# Patient Record
Sex: Male | Born: 1940 | Race: Black or African American | Hispanic: No | Marital: Single | Smoking: Never smoker
Health system: Southern US, Community
[De-identification: ages and names within clinical notes are randomized; demographics above are authoritative.]

## PROBLEM LIST (undated history)

## (undated) DIAGNOSIS — I10 Essential (primary) hypertension: Secondary | ICD-10-CM

## (undated) DIAGNOSIS — G8194 Hemiplegia, unspecified affecting left nondominant side: Secondary | ICD-10-CM

## (undated) DIAGNOSIS — N186 End stage renal disease: Secondary | ICD-10-CM

## (undated) DIAGNOSIS — I4510 Unspecified right bundle-branch block: Secondary | ICD-10-CM

## (undated) DIAGNOSIS — N2581 Secondary hyperparathyroidism of renal origin: Secondary | ICD-10-CM

## (undated) DIAGNOSIS — Z8673 Personal history of transient ischemic attack (TIA), and cerebral infarction without residual deficits: Secondary | ICD-10-CM

## (undated) DIAGNOSIS — E785 Hyperlipidemia, unspecified: Secondary | ICD-10-CM

## (undated) DIAGNOSIS — Z8719 Personal history of other diseases of the digestive system: Secondary | ICD-10-CM

## (undated) DIAGNOSIS — Z992 Dependence on renal dialysis: Secondary | ICD-10-CM

## (undated) DIAGNOSIS — C609 Malignant neoplasm of penis, unspecified: Secondary | ICD-10-CM

## (undated) DIAGNOSIS — H409 Unspecified glaucoma: Secondary | ICD-10-CM

## (undated) HISTORY — PX: DESTRUCTION LESION PENILE: SUR414

## (undated) HISTORY — PX: DG AV DIALYSIS GRAFT DECLOT OR: HXRAD813

---

## 1999-03-06 ENCOUNTER — Encounter (HOSPITAL_COMMUNITY): Admission: RE | Admit: 1999-03-06 | Discharge: 1999-06-04 | Payer: Self-pay | Admitting: Nephrology

## 1999-06-08 ENCOUNTER — Encounter (HOSPITAL_COMMUNITY): Admission: RE | Admit: 1999-06-08 | Discharge: 1999-09-06 | Payer: Self-pay | Admitting: Nephrology

## 1999-09-20 ENCOUNTER — Encounter (HOSPITAL_COMMUNITY): Admission: RE | Admit: 1999-09-20 | Discharge: 1999-12-19 | Payer: Self-pay | Admitting: Nephrology

## 1999-12-21 ENCOUNTER — Encounter (HOSPITAL_COMMUNITY): Admission: RE | Admit: 1999-12-21 | Discharge: 2000-03-20 | Payer: Self-pay | Admitting: Nephrology

## 2000-03-21 ENCOUNTER — Encounter (HOSPITAL_COMMUNITY): Admission: RE | Admit: 2000-03-21 | Discharge: 2000-06-19 | Payer: Self-pay | Admitting: Nephrology

## 2000-06-20 ENCOUNTER — Encounter (HOSPITAL_COMMUNITY): Admission: RE | Admit: 2000-06-20 | Discharge: 2000-09-18 | Payer: Self-pay | Admitting: Nephrology

## 2000-09-19 ENCOUNTER — Encounter (HOSPITAL_COMMUNITY): Admission: RE | Admit: 2000-09-19 | Discharge: 2000-12-18 | Payer: Self-pay | Admitting: Nephrology

## 2000-12-19 ENCOUNTER — Encounter (HOSPITAL_COMMUNITY): Admission: RE | Admit: 2000-12-19 | Discharge: 2001-03-19 | Payer: Self-pay | Admitting: Critical Care Medicine

## 2001-03-26 ENCOUNTER — Encounter (HOSPITAL_COMMUNITY): Admission: RE | Admit: 2001-03-26 | Discharge: 2001-06-24 | Payer: Self-pay | Admitting: Nephrology

## 2001-07-03 ENCOUNTER — Encounter (HOSPITAL_COMMUNITY): Admission: RE | Admit: 2001-07-03 | Discharge: 2001-10-01 | Payer: Self-pay | Admitting: Nephrology

## 2001-10-02 ENCOUNTER — Encounter: Admission: RE | Admit: 2001-10-02 | Discharge: 2001-12-31 | Payer: Self-pay | Admitting: Nephrology

## 2002-01-01 ENCOUNTER — Encounter (HOSPITAL_COMMUNITY): Admission: RE | Admit: 2002-01-01 | Discharge: 2002-04-01 | Payer: Self-pay | Admitting: Nephrology

## 2002-04-02 ENCOUNTER — Encounter (HOSPITAL_COMMUNITY): Admission: RE | Admit: 2002-04-02 | Discharge: 2002-07-01 | Payer: Self-pay | Admitting: Nephrology

## 2002-07-02 ENCOUNTER — Encounter (HOSPITAL_COMMUNITY): Admission: RE | Admit: 2002-07-02 | Discharge: 2002-09-30 | Payer: Self-pay | Admitting: Nephrology

## 2002-07-23 ENCOUNTER — Encounter: Payer: Self-pay | Admitting: Vascular Surgery

## 2002-07-28 ENCOUNTER — Ambulatory Visit (HOSPITAL_COMMUNITY): Admission: RE | Admit: 2002-07-28 | Discharge: 2002-07-28 | Payer: Self-pay | Admitting: Vascular Surgery

## 2002-10-01 ENCOUNTER — Encounter (HOSPITAL_COMMUNITY): Admission: RE | Admit: 2002-10-01 | Discharge: 2002-12-30 | Payer: Self-pay | Admitting: Nephrology

## 2002-11-06 ENCOUNTER — Ambulatory Visit (HOSPITAL_COMMUNITY): Admission: RE | Admit: 2002-11-06 | Discharge: 2002-11-06 | Payer: Self-pay | Admitting: Gastroenterology

## 2002-12-31 ENCOUNTER — Encounter (HOSPITAL_COMMUNITY): Admission: RE | Admit: 2002-12-31 | Discharge: 2003-03-31 | Payer: Self-pay | Admitting: Nephrology

## 2003-04-08 ENCOUNTER — Encounter (HOSPITAL_COMMUNITY): Admission: RE | Admit: 2003-04-08 | Discharge: 2003-07-07 | Payer: Self-pay | Admitting: Nephrology

## 2003-07-08 ENCOUNTER — Encounter (HOSPITAL_COMMUNITY): Admission: RE | Admit: 2003-07-08 | Discharge: 2003-10-06 | Payer: Self-pay | Admitting: Nephrology

## 2003-10-07 ENCOUNTER — Encounter (HOSPITAL_COMMUNITY): Admission: RE | Admit: 2003-10-07 | Discharge: 2004-01-05 | Payer: Self-pay | Admitting: Nephrology

## 2004-01-12 ENCOUNTER — Encounter (HOSPITAL_COMMUNITY): Admission: RE | Admit: 2004-01-12 | Discharge: 2004-04-11 | Payer: Self-pay | Admitting: Nephrology

## 2004-04-20 ENCOUNTER — Encounter (HOSPITAL_COMMUNITY): Admission: RE | Admit: 2004-04-20 | Discharge: 2004-07-19 | Payer: Self-pay | Admitting: Nephrology

## 2004-07-20 ENCOUNTER — Encounter (HOSPITAL_COMMUNITY): Admission: RE | Admit: 2004-07-20 | Discharge: 2004-10-18 | Payer: Self-pay | Admitting: Nephrology

## 2004-10-26 ENCOUNTER — Encounter (HOSPITAL_COMMUNITY): Admission: RE | Admit: 2004-10-26 | Discharge: 2005-01-24 | Payer: Self-pay | Admitting: Nephrology

## 2005-01-08 ENCOUNTER — Ambulatory Visit: Payer: Self-pay | Admitting: Hematology & Oncology

## 2005-02-01 ENCOUNTER — Encounter (HOSPITAL_COMMUNITY): Admission: RE | Admit: 2005-02-01 | Discharge: 2005-05-02 | Payer: Self-pay | Admitting: Nephrology

## 2005-02-28 ENCOUNTER — Ambulatory Visit: Payer: Self-pay | Admitting: Hematology & Oncology

## 2005-03-13 ENCOUNTER — Ambulatory Visit: Admission: RE | Admit: 2005-03-13 | Discharge: 2005-03-13 | Payer: Self-pay | Admitting: Hematology & Oncology

## 2005-03-13 ENCOUNTER — Encounter: Payer: Self-pay | Admitting: Hematology & Oncology

## 2005-03-13 ENCOUNTER — Encounter (INDEPENDENT_AMBULATORY_CARE_PROVIDER_SITE_OTHER): Payer: Self-pay | Admitting: *Deleted

## 2005-03-16 ENCOUNTER — Ambulatory Visit: Payer: Self-pay | Admitting: Hematology & Oncology

## 2005-05-03 ENCOUNTER — Encounter (HOSPITAL_COMMUNITY): Admission: RE | Admit: 2005-05-03 | Discharge: 2005-08-01 | Payer: Self-pay | Admitting: Nephrology

## 2005-08-02 ENCOUNTER — Encounter (HOSPITAL_COMMUNITY): Admission: RE | Admit: 2005-08-02 | Discharge: 2005-10-31 | Payer: Self-pay | Admitting: Nephrology

## 2005-11-01 ENCOUNTER — Encounter (HOSPITAL_COMMUNITY): Admission: RE | Admit: 2005-11-01 | Discharge: 2006-01-30 | Payer: Self-pay | Admitting: Nephrology

## 2006-02-07 ENCOUNTER — Encounter (HOSPITAL_COMMUNITY): Admission: RE | Admit: 2006-02-07 | Discharge: 2006-05-08 | Payer: Self-pay | Admitting: Nephrology

## 2006-05-16 ENCOUNTER — Encounter (HOSPITAL_COMMUNITY): Admission: RE | Admit: 2006-05-16 | Discharge: 2006-08-14 | Payer: Self-pay | Admitting: Critical Care Medicine

## 2006-08-15 ENCOUNTER — Encounter (HOSPITAL_COMMUNITY): Admission: RE | Admit: 2006-08-15 | Discharge: 2006-11-13 | Payer: Self-pay | Admitting: Nephrology

## 2006-11-14 ENCOUNTER — Encounter (HOSPITAL_COMMUNITY): Admission: RE | Admit: 2006-11-14 | Discharge: 2007-02-12 | Payer: Self-pay | Admitting: Nephrology

## 2006-12-16 ENCOUNTER — Ambulatory Visit (HOSPITAL_COMMUNITY): Admission: RE | Admit: 2006-12-16 | Discharge: 2006-12-16 | Payer: Self-pay | Admitting: Nephrology

## 2006-12-30 ENCOUNTER — Ambulatory Visit: Payer: Self-pay | Admitting: Surgery

## 2007-01-02 ENCOUNTER — Ambulatory Visit (HOSPITAL_COMMUNITY): Admission: RE | Admit: 2007-01-02 | Discharge: 2007-01-02 | Payer: Self-pay | Admitting: Surgery

## 2007-01-02 ENCOUNTER — Ambulatory Visit: Payer: Self-pay | Admitting: Surgery

## 2007-01-10 ENCOUNTER — Ambulatory Visit (HOSPITAL_COMMUNITY): Admission: RE | Admit: 2007-01-10 | Discharge: 2007-01-10 | Payer: Self-pay | Admitting: Surgery

## 2007-02-18 ENCOUNTER — Ambulatory Visit (HOSPITAL_COMMUNITY): Admission: RE | Admit: 2007-02-18 | Discharge: 2007-02-18 | Payer: Self-pay | Admitting: Vascular Surgery

## 2007-03-27 ENCOUNTER — Ambulatory Visit (HOSPITAL_COMMUNITY): Admission: RE | Admit: 2007-03-27 | Discharge: 2007-03-27 | Payer: Self-pay | Admitting: Nephrology

## 2007-05-20 ENCOUNTER — Emergency Department (HOSPITAL_COMMUNITY): Admission: EM | Admit: 2007-05-20 | Discharge: 2007-05-20 | Payer: Self-pay | Admitting: Emergency Medicine

## 2007-05-23 ENCOUNTER — Ambulatory Visit (HOSPITAL_COMMUNITY): Admission: RE | Admit: 2007-05-23 | Discharge: 2007-05-24 | Payer: Self-pay | Admitting: Urology

## 2007-05-23 ENCOUNTER — Encounter: Payer: Self-pay | Admitting: Urology

## 2007-05-23 HISTORY — PX: CIRCUMCISION: SUR203

## 2007-09-01 ENCOUNTER — Ambulatory Visit (HOSPITAL_COMMUNITY): Admission: RE | Admit: 2007-09-01 | Discharge: 2007-09-01 | Payer: Self-pay | Admitting: Nephrology

## 2007-09-29 ENCOUNTER — Ambulatory Visit: Payer: Self-pay | Admitting: Surgery

## 2007-10-04 ENCOUNTER — Ambulatory Visit (HOSPITAL_COMMUNITY): Admission: RE | Admit: 2007-10-04 | Discharge: 2007-10-04 | Payer: Self-pay | Admitting: Vascular Surgery

## 2007-10-04 ENCOUNTER — Ambulatory Visit: Payer: Self-pay | Admitting: Vascular Surgery

## 2007-11-26 ENCOUNTER — Ambulatory Visit (HOSPITAL_COMMUNITY): Admission: RE | Admit: 2007-11-26 | Discharge: 2007-11-26 | Payer: Self-pay | Admitting: Urology

## 2007-11-26 HISTORY — PX: OTHER SURGICAL HISTORY: SHX169

## 2008-01-07 ENCOUNTER — Ambulatory Visit (HOSPITAL_COMMUNITY): Admission: RE | Admit: 2008-01-07 | Discharge: 2008-01-07 | Payer: Self-pay | Admitting: Nephrology

## 2008-02-13 IMAGING — XA IR VENTRICULOPERITONEALSHUNT EVAL/INJ
2 series · 15 of 24 positions shown · non-contrast
Comparison: none

CLINICAL DATA: 66-year-old male with increased re-circulation and recent graft thrombosis.  
ULTRASOUND GUIDANCE FOR VASCULAR ACCESS:
LEFT UPPER EXTREMITY ARTERIOVENOUS SHUNTOGRAM:
Radiologist:  Oma Luttrell, M.D.
Guidance:  Ultrasound.
Complications:  No immediate complications.
Medications:  1% local lidocaine initially for the shuntogram.
Procedure/Findings:  Informed consent was obtained from the patient following explanation of the procedure, risks, benefits, and alternatives.  The patient understands, agrees, and consents.  All questions were addressed.
Ultrasound interrogation confirms thrombosis of the left forearm straight graft.  No palpable thrill or pulse.  Under sterile conditions and local anesthesia, micropuncture access was performed followed by insertion of a guidewire.  A 6-French sheath was inserted.  Through the sheath, a Kumpe catheter and a glidewire were used to attempt access of the venous anastomosis into the central veins.  With some difficulty, eventually the venous anastomosis was crossed.  A pull-back venogram demonstrated thrombosis of the outflow cephalic vein with clavicular region collaterals.  This is suspicious for central occlusion.  The venous anastomosis was also visualized.  There was some contrast extravasation in the upper arm with stagnation of flow.  The entire graft was thrombosed.  With the glidewire and Kumpe catheter, attempts were utilized to access the central veins; however, this was unsuccessful.  A subtraction central venogram was performed, confirming central venous occlusion of the cephalic vein and thrombosis.   Collateralization eventually opacifies the small caliber subclavian and innominate veins which are patent.   Because of the central outflow cephalic occlusion and thrombosis, the graft was not thrombectomized.  The central cephalic occlusion appears chronic with extensive collateralization.  Therefore, the access site will likely re-thrombosis following thrombectomy and thrombolysis.  At this point, the procedure was stopped.  The catheter and guidewires were removed.  The sheath was removed.  Hemostasis was achieved with compression.  The patient tolerated the procedure well.

[Series 1: run · 9 of 17 slices shown (1 of 2)]
[im 1/17]
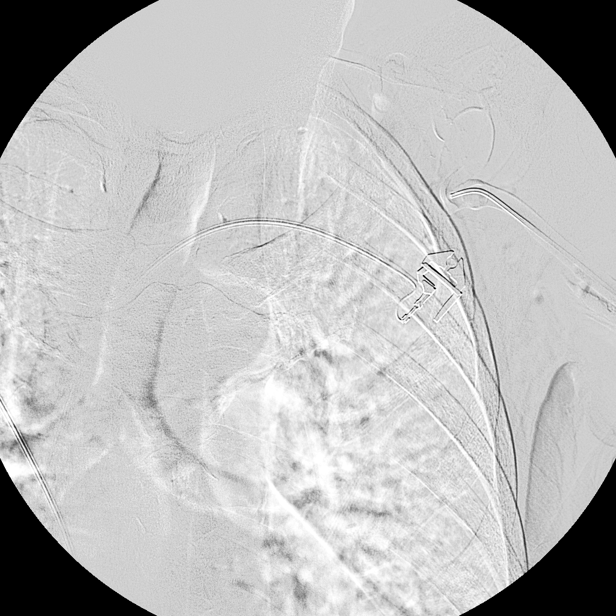
[im 3/17]
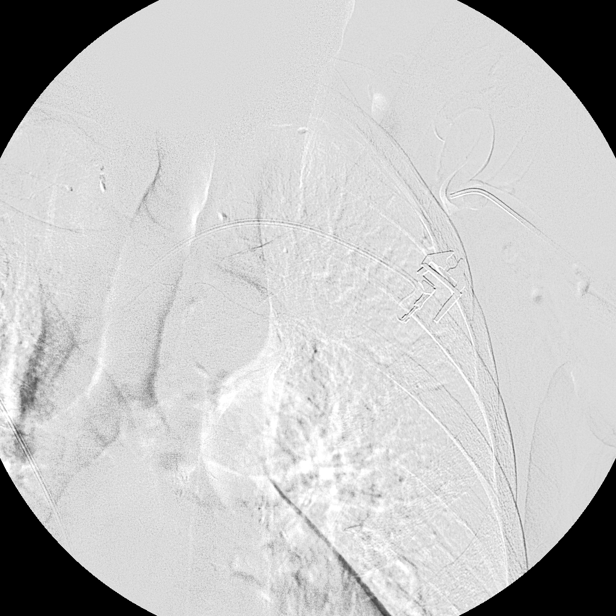
[im 5/17]
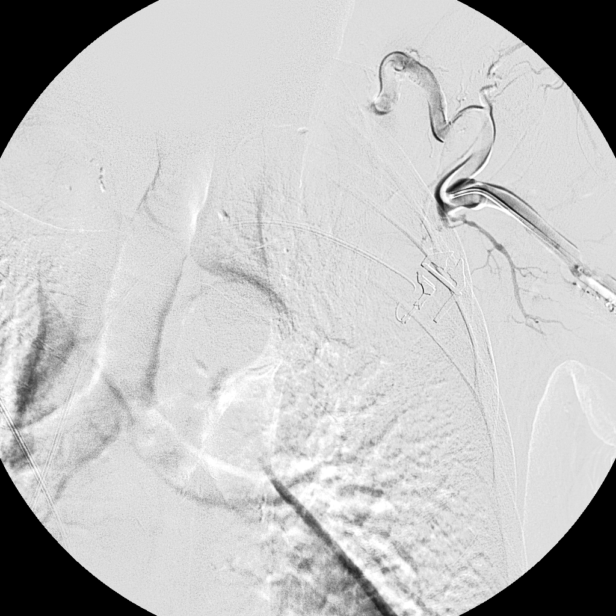
[im 6/17]
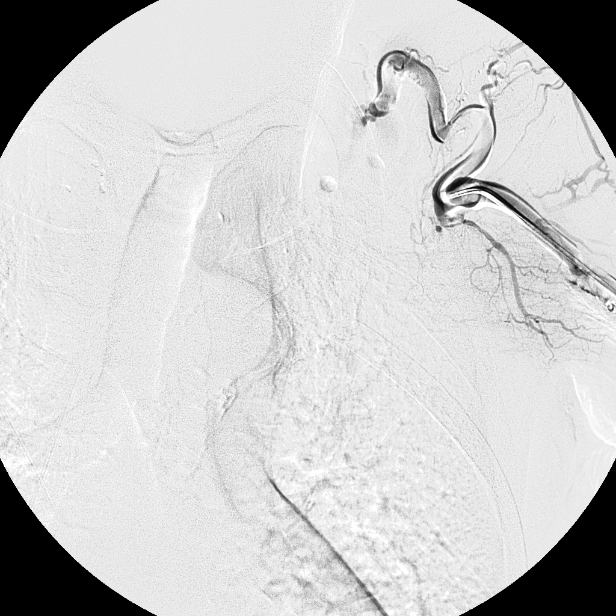
[im 9/17]
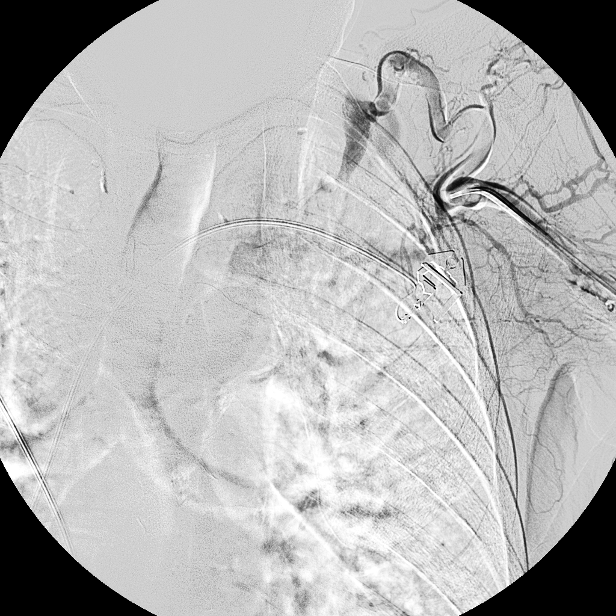
[im 10/17]
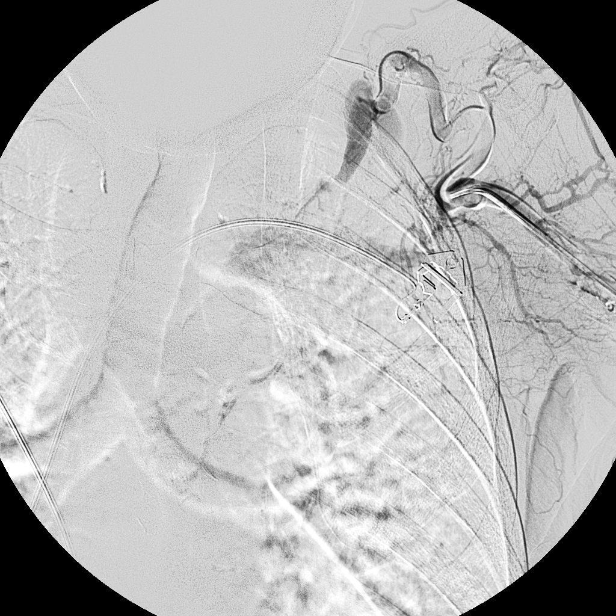
[im 12/17]
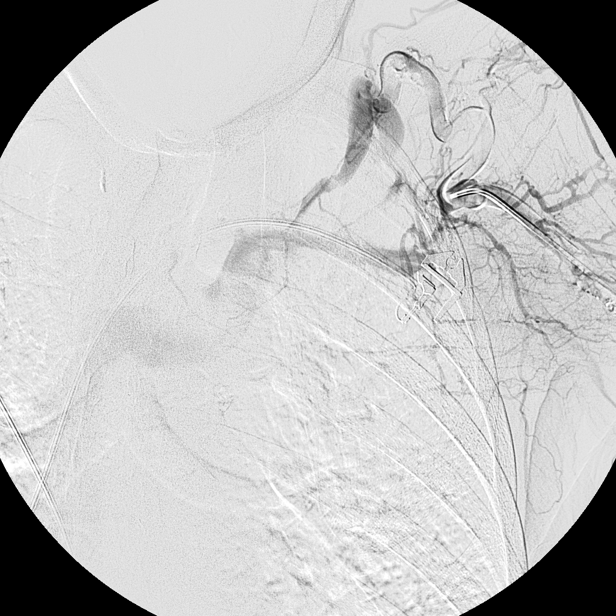
[im 14/17]
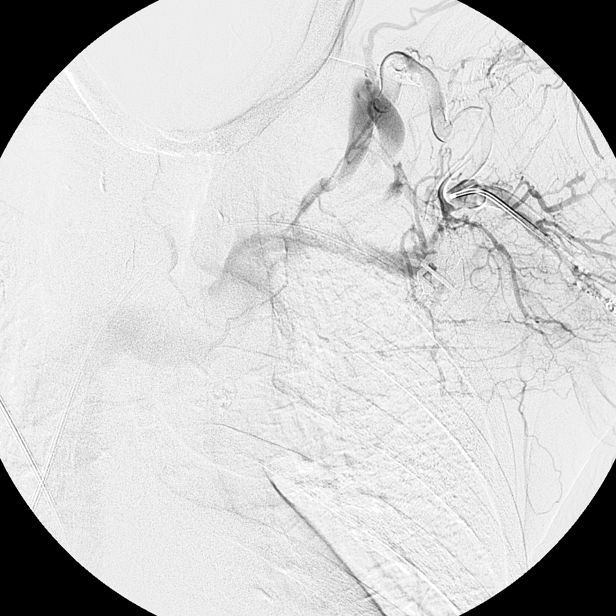
[im 15/17]
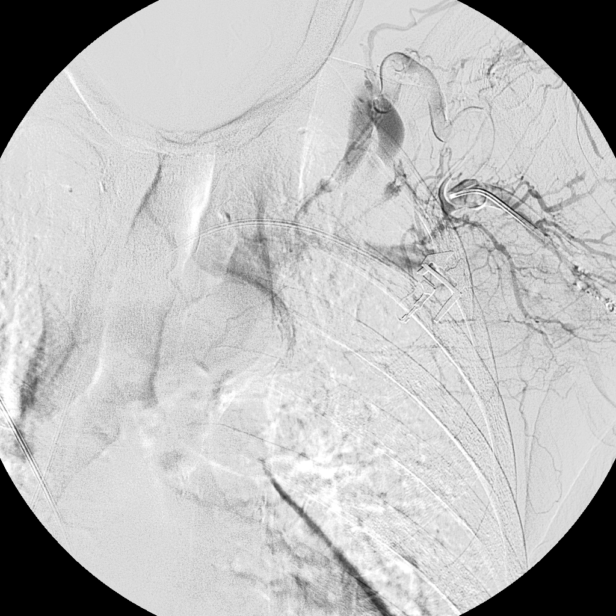

[Series 1: run · 6 of 11 slices shown (2 of 2)]
[im 1/11]
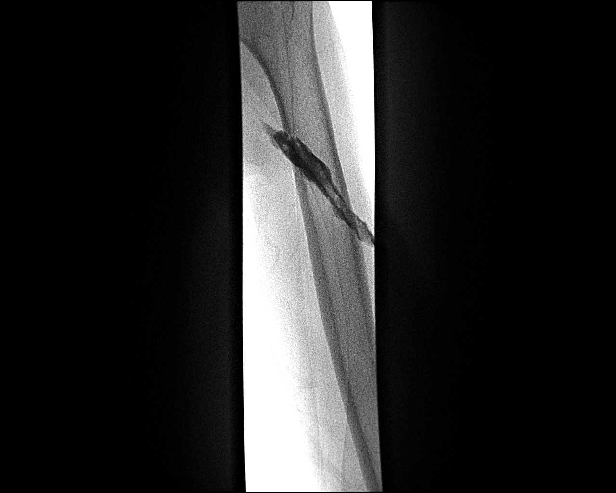
[im 2/11]
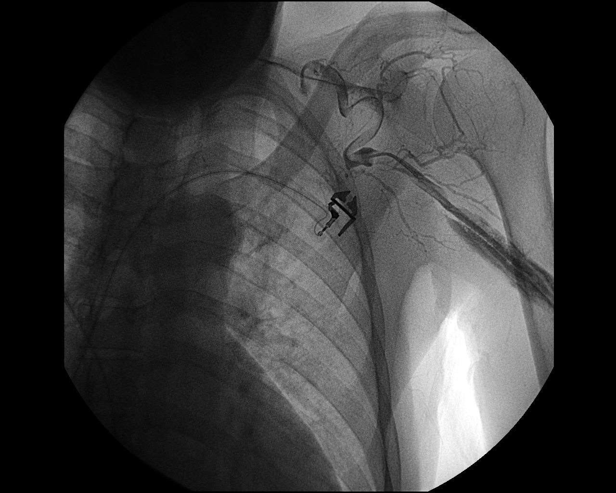
[im 4/11]
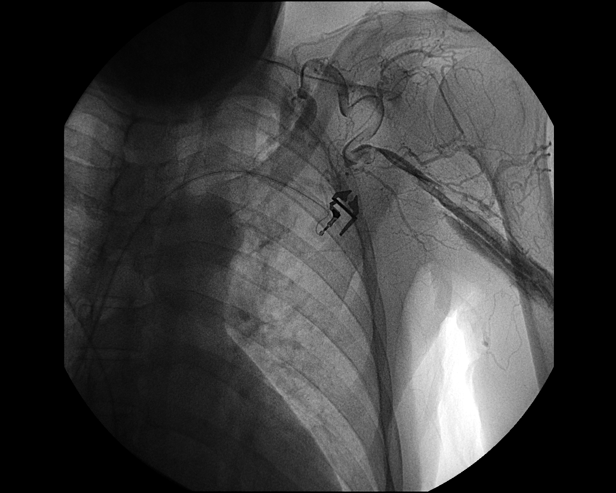
[im 7/11]
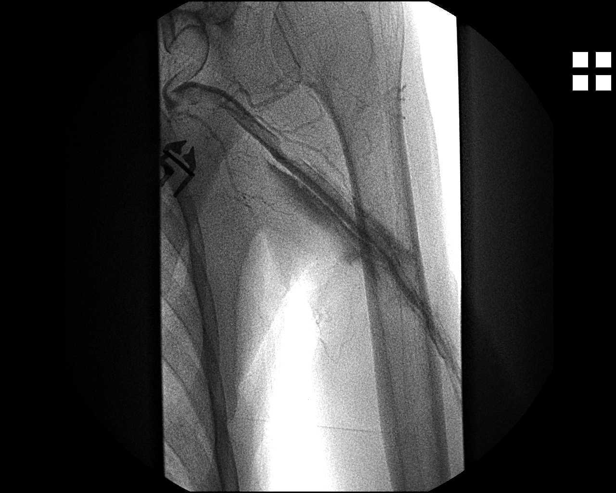
[im 8/11]
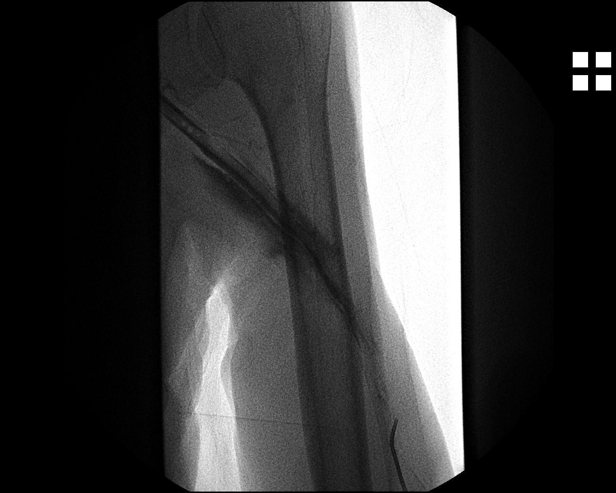
[im 11/11]
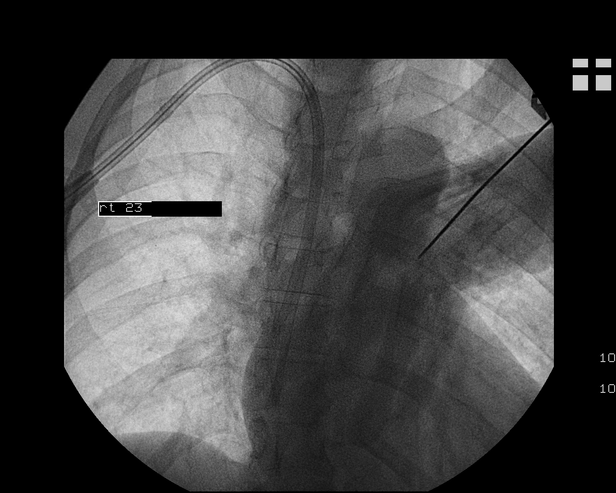

[15 of 24 positions shown; findings below may reference images not displayed]

IMPRESSION: 1.  Thrombosed left upper arm straight graft.
2.  Venous outflow via the cephalic vein demonstrates central occlusion and thrombosis.  Therefore, the procedure was terminated.  Right IJ catheter will be inserted.

## 2008-03-31 ENCOUNTER — Ambulatory Visit (HOSPITAL_COMMUNITY): Admission: RE | Admit: 2008-03-31 | Discharge: 2008-03-31 | Payer: Self-pay | Admitting: Nephrology

## 2008-05-24 IMAGING — US IR US GUIDE VASC ACCESS LEFT
1 series · 1 of 1 positions shown · non-contrast
Comparison: none

CLINICAL DATA: Occluded left arm dialysis graft.

Dialysis graft declot:
Venous angioplasty:
TECHNIQUE: The left upper arm straight graft was accessed antegrade with
21-gauge micropuncture needle near the arterial anastomosis under real-time
ultrasonic guidance after the overlying skin prepped with Betadine, draped in
usual sterile fashion, infiltrated locally with 1% lidocaine. Needle exchanged
over 018 guidewire for transitional dilator through which 1 mg t-PA was
administered. In similar fashion, the central aspect of the graft accessed
retrograde under ultrasound with a micropuncture needle, exchanged for a
transitional dilator for  administration of 1 mg t-PA. Through the antegrade
dilator, a Bentson wire was advanced to the venous anastomosis. Over this a 6F
sheath was placed, through which a 5 French Kumpe catheter was advanced for
outflow venography. This showed patency of the outflow  venous system through
the SVC. The Arrow PTD device was used to macerate thrombus in the graft.
Injection showed   clearance of thrombus from the graft. Over a guidewire a 7 mm
x 4 cm Conquest angioplasty balloon was advanced to the venous anastomosis.    
Balloon angioplasty of the  venous anastomosis was performed with good response.
Balloon was removed and The venous limb dilator was exchanged in similar fashion
for a 6 French vascular sheath. The PTD device was advanced across the arterial
anastomosis and used to dislodge the platelet plug  into the arterial limb of
the graft, where it was macerated. The PTD device was used again to remove any
residual platelet plug from the arterial anastomosis. Followup shuntogram showed
good flow through the outflow vein, no extravasation. The catheter and sheaths
were then removed and hemostasis achieved with 2-0 Ethilon sutures. Patient
tolerated procedure well.

[Series 1: sp us guide vasc access*left* · 1 of 1 slices shown]
[im 1/1]
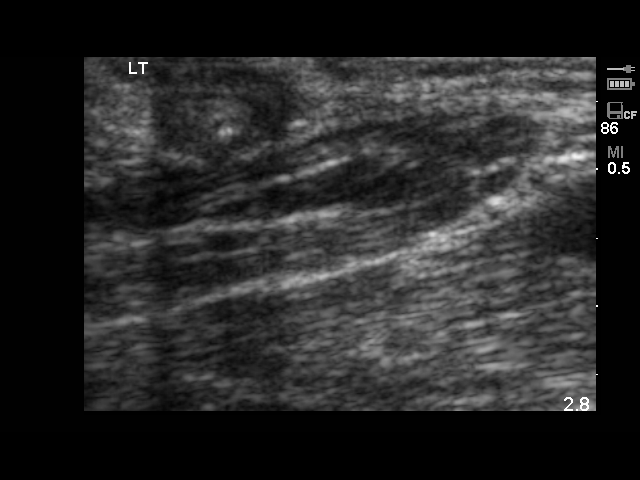

[1 of 1 positions shown; findings below may reference images not displayed]

IMPRESSION: 1. Technically successful declot of left upper arm straight hemodialysis graft.
2. Technically successful balloon angioplasty of venous anastomotic stenosis.

## 2008-06-09 ENCOUNTER — Ambulatory Visit (HOSPITAL_COMMUNITY): Admission: RE | Admit: 2008-06-09 | Discharge: 2008-06-09 | Payer: Self-pay | Admitting: Nephrology

## 2008-09-10 ENCOUNTER — Ambulatory Visit (HOSPITAL_COMMUNITY): Admission: RE | Admit: 2008-09-10 | Discharge: 2008-09-10 | Payer: Self-pay | Admitting: Nephrology

## 2008-11-12 ENCOUNTER — Ambulatory Visit (HOSPITAL_COMMUNITY): Admission: RE | Admit: 2008-11-12 | Discharge: 2008-11-12 | Payer: Self-pay | Admitting: Nephrology

## 2009-01-12 ENCOUNTER — Ambulatory Visit (HOSPITAL_COMMUNITY): Admission: RE | Admit: 2009-01-12 | Discharge: 2009-01-12 | Payer: Self-pay | Admitting: Nephrology

## 2009-03-11 ENCOUNTER — Ambulatory Visit (HOSPITAL_COMMUNITY): Admission: RE | Admit: 2009-03-11 | Discharge: 2009-03-11 | Payer: Self-pay | Admitting: Nephrology

## 2009-05-11 ENCOUNTER — Ambulatory Visit (HOSPITAL_COMMUNITY): Admission: RE | Admit: 2009-05-11 | Discharge: 2009-05-11 | Payer: Self-pay | Admitting: Nephrology

## 2009-05-16 ENCOUNTER — Ambulatory Visit: Payer: Self-pay | Admitting: Surgery

## 2009-06-01 ENCOUNTER — Ambulatory Visit (HOSPITAL_COMMUNITY): Admission: RE | Admit: 2009-06-01 | Discharge: 2009-06-01 | Payer: Self-pay | Admitting: Surgery

## 2009-06-01 ENCOUNTER — Ambulatory Visit: Payer: Self-pay | Admitting: Surgery

## 2009-07-22 ENCOUNTER — Ambulatory Visit (HOSPITAL_COMMUNITY): Admission: RE | Admit: 2009-07-22 | Discharge: 2009-07-22 | Payer: Self-pay | Admitting: Urology

## 2009-07-22 HISTORY — PX: OTHER SURGICAL HISTORY: SHX169

## 2009-09-28 ENCOUNTER — Ambulatory Visit (HOSPITAL_COMMUNITY): Admission: RE | Admit: 2009-09-28 | Discharge: 2009-09-28 | Payer: Self-pay | Admitting: Nephrology

## 2010-02-19 DIAGNOSIS — Z8719 Personal history of other diseases of the digestive system: Secondary | ICD-10-CM

## 2010-02-19 HISTORY — DX: Personal history of other diseases of the digestive system: Z87.19

## 2010-03-06 ENCOUNTER — Ambulatory Visit (HOSPITAL_COMMUNITY)
Admission: RE | Admit: 2010-03-06 | Discharge: 2010-03-06 | Payer: Self-pay | Source: Home / Self Care | Attending: Nephrology | Admitting: Nephrology

## 2010-03-22 ENCOUNTER — Encounter: Payer: Self-pay | Admitting: Interventional Radiology

## 2010-04-13 ENCOUNTER — Inpatient Hospital Stay (HOSPITAL_COMMUNITY)
Admission: EM | Admit: 2010-04-13 | Discharge: 2010-04-16 | DRG: 377 | Disposition: A | Discharge status: Home / Self Care | Payer: Medicare Other | Attending: Internal Medicine | Admitting: Internal Medicine

## 2010-04-13 ENCOUNTER — Emergency Department (HOSPITAL_COMMUNITY): Payer: Medicare Other

## 2010-04-13 DIAGNOSIS — D62 Acute posthemorrhagic anemia: Secondary | ICD-10-CM | POA: Diagnosis present

## 2010-04-13 DIAGNOSIS — I69959 Hemiplegia and hemiparesis following unspecified cerebrovascular disease affecting unspecified side: Secondary | ICD-10-CM

## 2010-04-13 DIAGNOSIS — M109 Gout, unspecified: Secondary | ICD-10-CM | POA: Diagnosis present

## 2010-04-13 DIAGNOSIS — K5731 Diverticulosis of large intestine without perforation or abscess with bleeding: Principal | ICD-10-CM | POA: Diagnosis present

## 2010-04-13 DIAGNOSIS — D638 Anemia in other chronic diseases classified elsewhere: Secondary | ICD-10-CM | POA: Diagnosis present

## 2010-04-13 DIAGNOSIS — E1169 Type 2 diabetes mellitus with other specified complication: Secondary | ICD-10-CM | POA: Diagnosis present

## 2010-04-13 DIAGNOSIS — N2581 Secondary hyperparathyroidism of renal origin: Secondary | ICD-10-CM | POA: Diagnosis present

## 2010-04-13 DIAGNOSIS — I120 Hypertensive chronic kidney disease with stage 5 chronic kidney disease or end stage renal disease: Secondary | ICD-10-CM | POA: Diagnosis present

## 2010-04-13 DIAGNOSIS — D696 Thrombocytopenia, unspecified: Secondary | ICD-10-CM | POA: Diagnosis present

## 2010-04-13 DIAGNOSIS — N186 End stage renal disease: Secondary | ICD-10-CM | POA: Diagnosis present

## 2010-04-13 DIAGNOSIS — Z8549 Personal history of malignant neoplasm of other male genital organs: Secondary | ICD-10-CM

## 2010-04-13 LAB — CBC
HCT: 32.9 % — ABNORMAL LOW (ref 39.0–52.0)
HCT: 27.7 % — ABNORMAL LOW (ref 39.0–52.0)
MCH: 29.1 pg (ref 26.0–34.0)
MCV: 88.5 fL (ref 78.0–100.0)
Platelets: 159 10*3/uL (ref 150–400)
Platelets: 130 10*3/uL — ABNORMAL LOW (ref 150–400)
RBC: 3.13 MIL/uL — ABNORMAL LOW (ref 4.22–5.81)
RDW: 17.6 % — ABNORMAL HIGH (ref 11.5–15.5)
RDW: 17.6 % — ABNORMAL HIGH (ref 11.5–15.5)
WBC: 6.9 10*3/uL (ref 4.0–10.5)

## 2010-04-13 LAB — DIFFERENTIAL
Basophils Absolute: 0 10*3/uL (ref 0.0–0.1)
Eosinophils Relative: 6 % — ABNORMAL HIGH (ref 0–5)
Lymphocytes Relative: 16 % (ref 12–46)

## 2010-04-13 LAB — COMPREHENSIVE METABOLIC PANEL
ALT: 9 U/L (ref 0–53)
Albumin: 3 g/dL — ABNORMAL LOW (ref 3.5–5.2)
Alkaline Phosphatase: 60 U/L (ref 39–117)
Calcium: 9.1 mg/dL (ref 8.4–10.5)
Potassium: 4.5 mEq/L (ref 3.5–5.1)
Sodium: 143 mEq/L (ref 135–145)
Total Protein: 7.1 g/dL (ref 6.0–8.3)

## 2010-04-13 LAB — POCT I-STAT, CHEM 8
Glucose, Bld: 144 mg/dL — ABNORMAL HIGH (ref 70–99)
HCT: 36 % — ABNORMAL LOW (ref 39.0–52.0)
Hemoglobin: 12.2 g/dL — ABNORMAL LOW (ref 13.0–17.0)
Potassium: 4.2 mEq/L (ref 3.5–5.1)
Sodium: 139 mEq/L (ref 135–145)

## 2010-04-13 LAB — PROTIME-INR
INR: 1.14 (ref 0.00–1.49)
Prothrombin Time: 14.8 seconds (ref 11.6–15.2)

## 2010-04-13 LAB — GLUCOSE, CAPILLARY: Glucose-Capillary: 73 mg/dL (ref 70–99)

## 2010-04-13 LAB — APTT: aPTT: 76 seconds — ABNORMAL HIGH (ref 24–37)

## 2010-04-13 LAB — HEMOGLOBIN AND HEMATOCRIT, BLOOD
HCT: 21.9 % — ABNORMAL LOW (ref 39.0–52.0)
Hemoglobin: 7.1 g/dL — ABNORMAL LOW (ref 13.0–17.0)

## 2010-04-13 MED ORDER — SODIUM PERTECHNETATE TC 99M INJECTION: 25.0000 | Freq: Once | INTRAVENOUS | Status: AC | PRN

## 2010-04-13 MED ORDER — TECHNETIUM TC 99M-LABELED RED BLOOD CELLS IV KIT: 25.0000 | PACK | Freq: Once | INTRAVENOUS | Status: AC | PRN

## 2010-04-14 LAB — CROSSMATCH: Unit division: 0

## 2010-04-14 LAB — CBC
HCT: 30.2 % — ABNORMAL LOW (ref 39.0–52.0)
HCT: 32.2 % — ABNORMAL LOW (ref 39.0–52.0)
Hemoglobin: 9.9 g/dL — ABNORMAL LOW (ref 13.0–17.0)
MCV: 88 fL (ref 78.0–100.0)
Platelets: 111 10*3/uL — ABNORMAL LOW (ref 150–400)
RBC: 3.44 MIL/uL — ABNORMAL LOW (ref 4.22–5.81)
RBC: 3.66 MIL/uL — ABNORMAL LOW (ref 4.22–5.81)
WBC: 4.2 10*3/uL (ref 4.0–10.5)
WBC: 4.3 10*3/uL (ref 4.0–10.5)

## 2010-04-14 LAB — BASIC METABOLIC PANEL
CO2: 32 mEq/L (ref 19–32)
Glucose, Bld: 91 mg/dL (ref 70–99)
Potassium: 4.3 mEq/L (ref 3.5–5.1)
Sodium: 139 mEq/L (ref 135–145)

## 2010-04-14 LAB — GLUCOSE, CAPILLARY
Glucose-Capillary: 124 mg/dL — ABNORMAL HIGH (ref 70–99)
Glucose-Capillary: 63 mg/dL — ABNORMAL LOW (ref 70–99)

## 2010-04-15 ENCOUNTER — Inpatient Hospital Stay (HOSPITAL_COMMUNITY): Payer: Medicare Other

## 2010-04-15 LAB — RENAL FUNCTION PANEL
Calcium: 8.9 mg/dL (ref 8.4–10.5)
Creatinine, Ser: 7.05 mg/dL — ABNORMAL HIGH (ref 0.4–1.5)
Glucose, Bld: 82 mg/dL (ref 70–99)
Phosphorus: 4.1 mg/dL (ref 2.3–4.6)
Sodium: 130 mEq/L — ABNORMAL LOW (ref 135–145)

## 2010-04-15 LAB — CBC
HCT: 34.3 % — ABNORMAL LOW (ref 39.0–52.0)
MCH: 29.2 pg (ref 26.0–34.0)
MCH: 29.6 pg (ref 26.0–34.0)
MCHC: 32.9 g/dL (ref 30.0–36.0)
MCHC: 34.1 g/dL (ref 30.0–36.0)
MCV: 86.8 fL (ref 78.0–100.0)
Platelets: 108 10*3/uL — ABNORMAL LOW (ref 150–400)
RDW: 15.7 % — ABNORMAL HIGH (ref 11.5–15.5)
RDW: 15.6 % — ABNORMAL HIGH (ref 11.5–15.5)
WBC: 3.3 10*3/uL — ABNORMAL LOW (ref 4.0–10.5)

## 2010-04-15 LAB — GLUCOSE, CAPILLARY
Glucose-Capillary: 68 mg/dL — ABNORMAL LOW (ref 70–99)
Glucose-Capillary: 100 mg/dL — ABNORMAL HIGH (ref 70–99)

## 2010-04-16 LAB — CBC
HCT: 33.1 % — ABNORMAL LOW (ref 39.0–52.0)
MCH: 29.3 pg (ref 26.0–34.0)
MCHC: 32.9 g/dL (ref 30.0–36.0)
MCV: 89 fL (ref 78.0–100.0)
Platelets: 85 10*3/uL — ABNORMAL LOW (ref 150–400)
RDW: 15.5 % (ref 11.5–15.5)
WBC: 3.3 10*3/uL — ABNORMAL LOW (ref 4.0–10.5)

## 2010-04-17 LAB — GLUCOSE, CAPILLARY

## 2010-04-28 NOTE — Op Note (Signed)
NAME:  DACODA, FINLAY NO.:  [id number]  MEDICAL RECORD NO.:  [id number]           PATIENT TYPE:  I  LOCATION:  6730                         FACILITY:  [facility]  PHYSICIAN:  Petra Kuba, M.D.    DATE OF BIRTH:  Apr 06, 1940  DATE OF PROCEDURE:  04/13/2010 DATE OF DISCHARGE:                              OPERATIVE REPORT   PROCEDURE:  EGD with both the endoscopy and the pediatric colonoscope.  INDICATIONS:  GI bleeding initially thought lower in diverticuli; however, nuclear bleeding scan led him to duodenum.  Consent was signed after risks, benefits, methods, options thoroughly discussed prior to any sedation.  MEDICINES USED: 1. Fentanyl 50 mcg. 2. Versed 5 mg.  PROCEDURE:  The videoendoscope was inserted by direct vision.  He probably had a tiny hiatal hernia.  The esophagus was normal.  Scope passed into the stomach.  No blood was seen.  He did have a few antral erosions that were not actively bleeding and no blood was seen in the stomach.  Scope passed through a normal pylorus into a normal duodenal bulb around the C-loop to a normal second portion of the duodenum.  A quick look at the ampulla was normal.  No blood was seen distally. Unfortunately, we could not advance the scope any further.  Scope was withdrawn back to the bulb, which was normal.  Scope was drawn back to the stomach and retroflexed.  Cardia, fundus, angularis, lesser and greater curve were normal on retroflex visualization.  Straight visualization of the stomach did not reveal any additional findings. The scope was slowly withdrawn.  Again, a good look at the esophagus was normal.  Scope was removed.  We then inserted the pediatric colonoscope, which was advanced by direct vision through the esophagus and stomach into the duodenum and we were able to advance 40 or 50 more cm from that juncture, which seemed well past full ligament of Treitz. Unfortunately, at that junction, there was a  difficult turn and we looped with trying to advance and we elected to withdrawal.  There was no blood in the duodenum or proximal jejunum.  The scope was slowly withdrawn back to the stomach.  Again, no additional findings were seen. Straight visualization of the stomach did not reveal any additional findings.  Air was suctioned.  Scope was slowly withdrawn.  Again, a good look at the esophagus was normal.  Scope was removed.  The patient tolerated the procedures well.  He had no obvious immediate complication.  ENDOSCOPIC DIAGNOSES: 1. Tiny hiatal hernia. 2. Few antral erosions, not actively bleeding. 3. Otherwise normal esophagogastroduodenoscopy. 4. Past the pediatric colonoscope and advanced 40-50 cm down the     duodenum probably past the ligament of Treitz without any blood or     other abnormalities being seen.  PLAN:  Rediscussed his nuclear medicine scan with the second radiologist who agrees with the reading.  In the meantime, we will continue clear liquids.  Follow H and H and consider repeat colonoscopy particularly if signs of bleeding continue, but if no further bleeding, might advance diet and think about an outpatient colonoscopy  at some point in the future.  We will follow with you.          ______________________________ Petra Kuba, M.D.     MEM/MEDQ  D:  04/13/2010  T:  04/14/2010  Job:  [id number]  cc:   Fayrene Fearing L. Deterding, M.D.  Electronically Signed by Vida Rigger M.D. on 04/27/2010 08:03:26 AM

## 2010-04-28 NOTE — Consult Note (Signed)
  NAME:  [name], [name] NO.:  [id number]  MEDICAL RECORD NO.:  [id number]           PATIENT TYPE:  I  LOCATION:  6730                         FACILITY:  [facility]  PHYSICIAN:  Petra Kuba, M.D.    DATE OF BIRTH:  [birth year]  DATE OF CONSULTATION:  04/13/2010 DATE OF DISCHARGE:                                CONSULTATION   HISTORY:  The patient is seen at the request of the ER physician for bright red blood per rectum.  He was getting dialysis and started having some bright red blood with minimal clots and was sent to the emergency room.  He did get some heparin today and may have been on some arthritis pills at home, although he does not remember the name.  He did have an endoscopy and colonoscopy by Dr. Loreta Ave in 2004 which did show some gastritis and diverticular disease, but he is not aware of any other GI tests.  Other than the blood, he has had no other lower bowel complaints and has no upper tract symptoms as well and no history of ulcers.  PAST MEDICAL HISTORY:  Pertinent for: 1. End-stage renal disease, on dialysis. 2. Hypertension. 3. History of CVA. 4. Chronic anemia. 5. Hyperparathyroidism. 6. Gout. 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 8. Diabetes.  SOCIAL HISTORY:  Pertinent for he does not smoke and does not drink, although he may be on some arthritis pills although the customary over- the-counter names did not ring a bell.  FAMILY HISTORY:  Negative for any obvious GI problems.  ALLERGIES:  ALLOPURINOL.  CURRENT MEDICINES AT HOME:  Eyedrops, MiraLax, lisinopril, Zocor, Sensipar, Rena-Vite PhosLo, and fish oil.  REVIEW OF SYSTEMS:  Negative except above.  PHYSICAL EXAMINATION:  GENERAL:  No acute distress, lying comfortably in the bed. VITAL SIGNS:  Stable, afebrile. ABDOMEN:  Soft and nontender. LUNGS:  Clear. HEART:  Regular rate and rhythm.  LABORATORY DATA:  Pertinent for BUN of 37, creatinine of 7.6.  LFTs are normal.   Cardiac enzymes negative.  Hemoglobin of 10.5 with a white count of 6.9 and platelets 159.  ASSESSMENT: 1. Bright red blood per rectum, probable diverticular disease. 2. Multiple medical problems.  PLAN:  Clear liquid diet, watch for signs of continual bleeding, probably proceed with a nuclear bleeding scan or colonoscopy pending clinical course.  Hold aspirin, nonsteroidals, and any blood thinners for now.  We will cover empirically with Protonix.  We will follow with you.          ______________________________ Petra Kuba, M.D.     MEM/MEDQ  D:  04/13/2010  T:  04/14/2010  Job:  [id number]  cc:   Fayrene Fearing L. Deterding, M.D.  Electronically Signed by Vida Rigger M.D. on 04/27/2010 08:03:22 AM

## 2010-05-08 LAB — BASIC METABOLIC PANEL
BUN: 30 mg/dL — ABNORMAL HIGH (ref 6–23)
CO2: 29 mEq/L (ref 19–32)
Calcium: 8.9 mg/dL (ref 8.4–10.5)
Chloride: 97 mEq/L (ref 96–112)
Creatinine, Ser: 6.95 mg/dL — ABNORMAL HIGH (ref 0.4–1.5)
Glucose, Bld: 89 mg/dL (ref 70–99)

## 2010-05-08 LAB — PROTIME-INR
INR: 1.06 (ref 0.00–1.49)
Prothrombin Time: 13.7 seconds (ref 11.6–15.2)

## 2010-05-08 LAB — CBC
MCHC: 31.6 g/dL (ref 30.0–36.0)
MCV: 98.3 fL (ref 78.0–100.0)
RDW: 18.1 % — ABNORMAL HIGH (ref 11.5–15.5)

## 2010-05-10 LAB — POCT I-STAT 4, (NA,K, GLUC, HGB,HCT)
Glucose, Bld: 85 mg/dL (ref 70–99)
Hemoglobin: 12.9 g/dL — ABNORMAL LOW (ref 13.0–17.0)
Potassium: 4.5 mEq/L (ref 3.5–5.1)

## 2010-05-10 NOTE — H&P (Signed)
NAME:  [name], [name]               ACCOUNT NO.:  [id number]  MEDICAL RECORD NO.:  [id number]           PATIENT TYPE:  E  LOCATION:  [facility]                         FACILITY:  [facility]  PHYSICIAN:  Clydia Llano, MD       DATE OF BIRTH:  [birth year]  DATE OF ADMISSION:  04/13/2010 DATE OF DISCHARGE:                             HISTORY & PHYSICAL   PRIMARY CARE PHYSICIAN:  James L. Deterding, MD  REASON FOR ADMISSION:  Bright red blood per rectum.  HISTORY OF PRESENT ILLNESS:  [name] is a 70 year old African American male with history of end-stage renal disease and hypertension. The patient came into the hospital complaining about passing bright red blood per rectum.  The patient was in his usual state of health until this morning when he went to have his dialysis.  He felt that he will have a bowel movement and he thought he had an accident actually while he was going to the bathroom.  He did have explosive bowel movement with stool with bright red blood in it.  He did find blood clots in his pants probably from the accident on his way to the bathroom.  The patient denies any nausea, vomiting.  Denies any abdominal pain.  He had some crampy discomfort before the bowel movement and it did resolve now.  He felt a little bit dizzy when he stood up from the dialysis machine going to the bathroom which can be attributed to the dialysis itself.  Upon initial evaluation in the emergency department, GI was called and his hemoglobin was found to be at 10.5.  PAST MEDICAL HISTORY: 1. End-stage renal disease secondary to nephrosclerosis, hypertension,     on dialysis since 2008. 2. Hypertension. 3. History of CVA. 4. Anemia secondary to end-stage renal disease. 5. Secondary hyperparathyroidism. 6. History of gout. 7. History of penile shaft squamous cell carcinoma followed by Dr.     Retta Diones. 8. Diabetes mellitus.  SOCIAL HISTORY:  The patient a nonsmoker, never smoked.  Not a  drinker. He used to drink socially when he was younger.  FAMILY HISTORY:  No family history of colon cancer.  ALLERGIES:  Drug allergy ALLOPURINOL.  MEDICATIONS: 1. Timolol ophthalmic solution 0.5% 1 drop each eye b.i.d. 2. Dorzolamide ophthalmic in both eyes 1 drop b.i.d. 3. Xalatan 0.005% ophthalmic solution 1 drop in both eyes daily at     bedtime. 4. MiraLax 1 cap full daily as needed for constipation. 5. Imiquimod 5% topical applied topically 3 times a week to the     affected area. 6. Lisinopril 10 mg at bedtime on nondialysis patient. 7. Simvastatin 40 mg p.o. at bedtime. 8. Sensipar 30 mg p.o. at bedtime. 9. Rena-Vite 300 mg every evening. 10.PhosLo 667 mg one cap 3 times a day with meals. 11.Fish oil 1000 mg p.o. daily.  REVIEW OF SYSTEMS:  GENERAL:  Positive for dizziness.  Otherwise negative.  EYES:  Denies any blurred vision.  ENT:  Denies any sore throat or hearing impairment.  CARDIOVASCULAR:  Denies any palpitation, chest pain.  RESPIRATORY:  Denies any cough, wheezes.  GASTROINTESTINAL: Per HPI.  Positive for blood in stools.  GU: Denies any CVA pain. MUSCULOSKELETAL:  Denies any joint pain.  SKIN:  Denies any rash. NEURO:  Denies any new focal or motor neurological weakness. PSYCHIATRIC:  Denies any abnormality of mood or affect.  PHYSICAL EXAMINATION:  VITAL SIGNS:  Temperature 97.6, respirations 14, pulse is 79, blood pressure is 103/58. GENERAL:  The patient is a well-developed, well-nourished African American male wearing hospital gown, lies on his back, in no acute distress. HEENT:  Head and face normocephalic, atraumatic.  Eyes:  Pupil reactive to light and accommodation. CARDIOVASCULAR:  Regular rate and rhythm.  No murmurs, rubs, or gallops. RESPIRATORY:  Clear to auscultation bilaterally. CHEST:  Movement symmetrical bilaterally. ABDOMEN:  Bowel sounds heard.  Soft, nontender or distended. EXTREMITIES:  Normal except dialysis graft in left upper  arm. NEURO:  Alert, awake, oriented x3.  No gross motor or sensory deficit. SKIN:  No rash.  No petechiae. PSYCHIATRIC:  Alert, awake, oriented to time, place, and person.  LABORATORY DATA: 1. BMP:  Sodium 143, potassium 4.5, chloride 99, bicarb is 30, glucose     150, BUN is 37, creatinine is 7.6, calcium is 9.1. 2. LFTs:  AST 13, ALT 9, total protein 7.1, total bili is 1, alk phos     is 60. 3. Cardiac enzymes:  CK-MB less than 1, troponin less than 0.05,     myoglobin is 440. 4. CBC:  WBC 6.9, hemoglobin 10.5, hematocrit 32.9, platelets 159.     His hemoglobin in June 2011 with 13.5.  ASSESSMENT AND PLAN: 1. Bright red blood per rectum.  The patient will be admitted to the     hospital for telemetry bed and GI already consulted.  Dr. Ewing Schlein     from [facility] has seen him, recommended clear liquids     and to bleeding scan for signs of occult bleeding.  The patient     will be admitted.  The patient will be checked for hemoglobin every     12 hours.  He will be on clear liquids for now. 2. End-stage renal disease, on hemodialysis.  We will Nephrology to     come and see him for routine hemodialysis. 3. Hypertension.  Blood pressure is marginal.  The patient was taking     lisinopril 10 mg every other day at home.  Probably this is his     baseline as he is taking the lisinopril every other day at home.     We will hold blood     pressure medications for now. 4. Anemia.  Probable, the patient has a degree of chronic anemia     secondary to end-stage renal disease.  We will monitor.  The     patient has hemoglobin checked in June 2011 and it was 14.     Clydia Llano, MD     ME/MEDQ  D:  04/13/2010  T:  04/13/2010  Job:  [id number]  Electronically Signed by Clydia Llano  on 05/10/2010 07:54:04 PM

## 2010-05-10 NOTE — Discharge Summary (Signed)
NAME:  [name], [name]               ACCOUNT NO.:  [id number]  MEDICAL RECORD NO.:  [id number]           PATIENT TYPE:  I  LOCATION:  6730                         FACILITY:  [facility]  PHYSICIAN:  Clydia Llano, MD       DATE OF BIRTH:  1940/08/20  DATE OF ADMISSION:  04/13/2010 DATE OF DISCHARGE:  04/16/2010                              DISCHARGE SUMMARY   PRIMARY CARE PHYSICIAN:  Fayrene Fearing L. Deterding, MD.  REASON FOR ADMISSION:  Bright red blood per rectum.  DISCHARGE DIAGNOSES: 1. Lower gastrointestinal bleed likely secondary to diverticulosis. 2. Anemia secondary to acute blood loss. 3. End-stage renal disease. 4. Hypertension. 5. History of cerebrovascular accident with left-sided hemiparesis. 6. Secondary hyperparathyroidism. 7. History of gout. 8. History of penile squamous cell carcinoma, followed by Dr.     Retta Diones. 9. Thrombocytopenia, mild.  DISCHARGE MEDICATIONS: 1. Dorzolamide ophthalmic 1 drop in both eyes twice daily.2. Dialysis-related medication on dialysis days. 3. Fish oil 1000 mg p.o. daily. 4. Imiquimod 5% apply topically three times a week for effected area. 5. Lisinopril 10 mg at bedtime, on dialysis days. 6. MiraLax one capful daily as needed for constipation. 7. PhosLo 667 mg p.o. three times daily with meals. 8. Rena-Vite 300 mg 1 tablet p.o. at bedtime. 9. Sensipar 330 mg p.o. at bedtime. 10.Simvastatin 40 mg p.o. at bedtime. 11.Timolol ophthalmic solution 0.5% one drop to both eyes twice daily. 12.Xalatan 0.005% ophthalmic solution 1 drop to both eyes daily at     bedtime.  RADIOLOGY:  Technetium-labeled red scan and bleeding scan showed positive for acute GI bleed.  Bleeding source favored to be in the duodenum as mentioned above.  Chest x-ray, April 13, 2009, showed no acute cardiopulmonary abnormalities.  BRIEF HISTORY EXAMINATION:  Mr. [name] is 70 year old African-American male with history of end-stage renal disease and hypertension.   The patient came in to the ER hospital complaining about passing bright red blood per rectum.  The patient was in his usual state of health until the morning of admission when he went to have his dialysis.  The patient felt some crampy discomfort and he has to have bowel movement and he thought he had an accident while he had dialysis.  He went to the bathroom.  He had explosive bowel movement with stool with bright red blood around it.  He did find some blood clots in his pants and probably from the accident on his way to the bathroom.  The patient denies nausea or vomiting.  Denies recent weight loss.  The patient has diverticulosis shown in colonoscopy done in 2004.  The patient's hemoglobin at time of admission was 10.5.  The patient admitted to the hospital for further evaluation. 1. Lower GI bleed.  Upon evaluation in the emergency department,     Gastroenterology Service was consulted.  They recommended to follow     up the patient and go for bleeding scan if the patient bleeds     again.  The next check of the patient's hemoglobin his hemoglobin     dropped to 9.1, so bleeding scan was ordered and surprisingly it  showed possibility of acute GI bleed.  Emergent EGD was done to     about 40-50 cm in the small bowel using pediatric colonoscope,     which showed normal upper elementary tract.  The next day     colonoscopy was done, which showed pancolonic diverticulosis with     no evidence of acute bleed.  GI recommended to advance diet, which     advanced.  The patient did not have any other episodes of passing     blood and he was felt to be safe for discharge. 2. Anemia.  The patient has chronic kidney disease, but normally he     did not have anemia according to the records.  In June 2011, his     hemoglobin is 13.5.  Upon initial evaluation in the emergency     department, at this time his hemoglobins was 10.5, which dropped     gradually till it reached 7.1.  Please note  that the 7.1 was not     reported in the E-chart for some reason and he have to go to     clinical flow chart, which showed hemoglobin of 7.1.  Two units of     packed RBCs were transfused.  His hemoglobin on the day of     discharge is 10.9. 3. End-stage renal disease, on hemodialysis.  Followed by renal and     the patient had his routine dialysis in the hospital here without     interruption. 4. Hypertension.  The patient stayed normotensive while in the     hospital.  His lisinopril was held during the hospitalization and     restarted at discharge. 5. Thrombocytopenia.  Platelets dropped, the day of admission it was     159 and it was dropping consistently until it went down to 85.  As     far as I know, the patient did not have any heparin with the recent     hemodialysis while in the hospital because of the bleed.     Nephrology is aware and they will follow on that as outpatient.  DISCHARGE INSTRUCTIONS: 1. Disposition home. 2. Diet renal, a heart-healthy diet. 3. Activity as tolerated.     Clydia Llano, MD     ME/MEDQ  D:  04/16/2010  T:  04/16/2010  Job:  [id number]  cc:   Fayrene Fearing L. Deterding, M.D. Willis Modena, MD  Electronically Signed by Clydia Llano  on 05/10/2010 07:53:51 PM

## 2010-07-04 NOTE — Procedures (Signed)
CEPHALIC VEIN MAPPING   INDICATION:  ESRD.  Exam: right upper extremity cephalic vein map for  possible AVF placement.   HISTORY:   EXAM:  The right cephalic vein is partially compressible.   Diameter measurements range from 0.11 cm to 0.17 cm.   The left cephalic vein was not evaluated.   See attached worksheet for all measurements.   IMPRESSION:  Patient right cephalic vein which is not of acceptable  diameter for use as a dialysis access site.  The right cephalic vein  shows evidence of chronic wall thickening.    ___________________________________________  V. Charlena Cross, MD   AS/MEDQ  D:  12/30/2006  T:  12/31/2006  Job:  [id number]

## 2010-07-04 NOTE — Op Note (Signed)
[name], [name]               ACCOUNT NO.:  [id number]   MEDICAL RECORD NO.:  [id number]          PATIENT TYPE:  AMB   LOCATION:  SDS                          FACILITY:  [facility]   PHYSICIAN:  Juleen China IV, MDDATE OF BIRTH:  1940/07/21   DATE OF PROCEDURE:  DATE OF DISCHARGE:  01/10/2007                               OPERATIVE REPORT   PREOPERATIVE DIAGNOSIS:  End-stage renal disease.   POSTOPERATIVE DIAGNOSIS:  End-stage renal disease.   PROCEDURE PERFORMED:  Left upper arm arch graft with 4 x 7 Gore-Tex.   ANESTHESIA:  MAC.   BLOOD LOSS:  Minimal.   FINDINGS:  Adequate vein.   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  He has a contracted left arm.  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  A venogram of his left arm,  actually found that he does have patent central veins.  For that reason,  I elected to perform an arch graft.  Risks and benefits were discussed.  Informed consent was signed.   PROCEDURE:  The patient was identified in the holding area and taken to  room 8.  He was placed supine on the table.  MAC anesthesia was  administered.  The patient was prepped and draped in a standard sterile  fashion and time-out was called and perioperative antibiotics were  administered.  I first began up in the axilla.  After 1% lidocaine was  used, a longitudinal incision was made.  Subcutaneous tissue was divided  with Bovie cautery. The fascia was opened sharply and the brachial vein  was readily identified.  It was mobilized proximally and distally and  vessel loops were placed.  Once that exposed adequate vein, I turned my  attention towards the artery.  Again, 1% lidocaine was used as local  anesthesia.  Above the antecubital crease, a longitudinal incision was  made.  Bovie cautery is used to dissect through the subcutaneous  tissue.  The artery was identified and mobilized.  At the area where I chose to  perform the anastomosis, there was a very tortuous artery.  Once the  artery had been fully exposed, a subcutaneous tunnel was created using a  straight tunneler.  Once this is done, the patient was systemically  heparinized.  The arterial anastomosis was performed first.  A 4 x 7  Gore-Tex graft was brought through the tunnel.  The artery was occluded  proximally and distally with serrefine clamps.  A #15-blade was used to  open the artery and this was extended with Potts scissors.  The 4-mm end  of the graft was spatulated.  It was sewed into side using running 6-0  Prolene.  There was brisk pulsatile flow from the graft upon completion  of the anastomosis.  Next, the attention was turned towards the venous  limb.  The vein was occluded and then opened with a #11-blade.  The  venotomy was extended with Potts scissors.  A spatulated  end-to-side  anastomosis was then performed using running 6-0 Prolene.  Prior to  completion of anastomosis, the graft was flushed in antegrade and  retrograde fashion. The anastomosis was then secured and the clamps were  released.  There was a good thrill within the outflow vein.  The patient  had good Doppler signal in the radial and ulnar artery at the wrist.  Once hemostasis was achieved, the  wound was copiously irrigated.  The subcutaneous tissue was closed with  interrupted 3-0 Vicryl.  The skin was closed with a running 4-0 Vicryl.  Dermabond was placed.  The patient tolerated the procedure well and was  taken to the recovery room in stable condition.           ______________________________  V. Lawrence Cross, MD  Electronically Signed     VWB/MEDQ  D:  01/10/2007  T:  01/10/2007  Job:  [id number]

## 2010-07-04 NOTE — Assessment & Plan Note (Signed)
OFFICE VISIT   [name], [name]  DOB:  [birth year]                                       05/16/2009  EAVWU#:[id number]   Patient is a 70 year old gentleman with end-stage renal disease who  dialyzes through a left upper arm graft that was placed in 12/2006.  This is a 4 x 7 Gore-Tex graft that was revised in August 2009 into a  parallel axillary vein with an end-to-end anastomosis.  The patient has  required multiple percutaneous interventions for high-grade stenosis  distal to the anastomosis site.  This is a long-segment stenosis that is  requiring frequent interventions and has been sent for possible surgical  evaluation.   The patient suffers from hypertension and hypercholesterolemia, both of  which are medically managed.  He suffered a stroke and left-sided  hemiparesis.   PAST MEDICAL HISTORY:  Hypertension, hypercholesterolemia, end-stage  renal disease, history of stroke.   SOCIAL HISTORY:  Single with 3 children.  Does not smoke.  Has never  smoked.  Does drink alcohol on a regular basis.   REVIEW OF SYSTEMS:  Positive for constipation and arthritis as well as a  stroke with left-sided deficits.  Negative for chest pain.  Negative for  shortness of breath.  All review systems are negative as documented in  the encounter form.   Allergies are none.   PHYSICAL EXAMINATION:  Heart rate 71, blood pressure 128/71, temperature  is 97.9.  GENERAL:  He is well-appearing in no distress.  HEENT:  Within normal limits.  LUNGS:  Clear bilaterally.  CARDIOVASCULAR:  Regular rate and rhythm.  ABDOMEN:  Soft and scaphoid.  MUSCULOSKELETAL:  No major deformities or cyanosis except for a  contracture in his left wrist.  NEURO:  He has left-sided weakness.  SKIN:  Without rash.   DIAGNOSTIC STUDIES:  I reviewed his shuntogram.  This showed high-grade  stenosis, which is a long segment that was successfully dilated.  There  is a parallel axillary vein  which appears to be widely patent.   ASSESSMENT:  Poorly functioning left upper arm dialysis graft.   PLAN:  Since the patient is requiring approximately percutaneous  interventions every 2 months, I feel it is reasonable to proceed with  revision of his graft.  There appears to be a parallel axillary vein  just distal to the chest wall.  This appears to be without significant  disease.  I have recommended that we proceed with revision into this  parallel axillary vein.  The risks and benefits of the procedure are  discussed with the patient.  It will be scheduled for a nondialysis day.  He dialyzes on Tuesday, Thursday, Saturday.  His procedure has been  scheduled for Wednesday, 04/23.     Jorge Ny, MD  Electronically Signed   VWB/MEDQ  D:  05/16/2009  T:  05/17/2009  Job:  [id number]   cc:   Dyke Maes, M.D.

## 2010-07-04 NOTE — Op Note (Signed)
[name], [name]               ACCOUNT NO.:  [id number]   MEDICAL RECORD NO.:  [id number]          PATIENT TYPE:  AMB   LOCATION:  SDS                          FACILITY:  [facility]   PHYSICIAN:  Juleen China IV, MDDATE OF BIRTH:  October 31, 1940   DATE OF PROCEDURE:  01/02/2007  DATE OF DISCHARGE:                               OPERATIVE REPORT   DIAGNOSIS:  End-stage renal disease.   PROCEDURE:  Left upper extremity central venogram.   INDICATIONS:  The patient is a 70 year old being evaluated for graft  placement.   PROCEDURE IN DETAIL:  Using ultrasound, the left brachial vein was  access by myself and a 20-gauge IV was placed.  Through this IV,  contrast injections were performed.  The patient had a robust brachial  artery, which was tracking centrally and emptying into the heart.  This  would be adequate for upper arm dialysis grafting.  There was no  stenosis seen coming from this brachial vein.      Jorge Ny, MD  Electronically Signed     VWB/MEDQ  D:  01/02/2007  T:  01/02/2007  Job:  [id number]

## 2010-07-04 NOTE — Op Note (Signed)
NAME:  [name], [name]               ACCOUNT NO.:  [id number]   MEDICAL RECORD NO.:  [id number]          PATIENT TYPE:  AMB   LOCATION:  DAY                          FACILITY:  [facility]   PHYSICIAN:  Bertram Millard. 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   POSTOPERATIVE DIAGNOSIS:  Recurrent squamous cell carcinoma of the  penis.   PROCEDURE:  CO2 laser of penile lesion/recurrence of squamous cell  cancer.   SURGEON:  Bertram Millard. Dahlstedt, M.D.   ANESTHESIA:  MAC, dorsal penile block.   COMPLICATIONS:  None.   SPECIMENS:  None.   BRIEF HISTORY:  A 70 year old male with history of superficially  invasive squamous cell carcinoma of the penis.  He underwent radical  circumcision to try to preserve his penis several months ago.  On recent  follow-up, has been found to have a small recurrence in his circumcision  wound.  It was recommended that he have local ablation to this rather  than excisional therapy.  He comes in now for CO2 laser of this area.  The risks and complications have been discussed with the patient.  He  understands these and desires to proceed.   DESCRIPTION OF PROCEDURE:  The patient was administered preoperative IV  antibiotics.  He was taken to the operating room where he was placed in  the supine position on the OR table.  The genitalia and perineum were  prepped and draped.  A timeout was then called.   The CO2 laser was then used to totally ablate a 5 x 4 mm lesion on the  right circumcision wound.  Several different applications of the laser  were used to get deep penetration.  Prior to laser, a dorsal penile  block was performed with 10 mL of a combination of quarter-percent plain  Marcaine and 1% plain lidocaine.  A Xeroform gauze dressing was placed.  The patient tolerated the  procedure well and was transported to the PACU  in stable condition.      Bertram Millard. Dahlstedt, M.D.  Electronically Signed     SMD/MEDQ  D:  11/26/2007  T:  11/27/2007  Job:  [id number]

## 2010-07-04 NOTE — Assessment & Plan Note (Signed)
OFFICE VISIT   AD, GUTTMAN  DOB:  1940-04-26                                       12/30/2006  [id number]#:[id number]   REASON FOR VISIT:  Evaluate for long-term dialysis.   HISTORY:  This is a 70 year old gentleman who had previously been  dialyzing through a left arm fistula, which thrombosed several weeks  ago.  Attempts were made to lyse; however, they were not successful.  Per the patient, he had occlusion of his central veins.  The patient  says that he has not had any swelling.  He has not had any pain since  his graft has clotted.  He is now dialyzing through a right-sided  catheter.  The patient had history of high blood pressure, which he has  managed with Inderal, minoxidil.  He also has hypercholesterolemia, for  which he takes Simvastatin.  Positive for a stroke with left-sided  hemiparesis.   SOCIAL HISTORY:  He is single with 3 children.  Does not smoke, has  never smoked.  He does drink alcohol on a regular basis.   REVIEW OF SYSTEMS:  CARDIAC:  Negative.  PULMONARY:  Negative.   PHYSICAL EXAMINATION:  VITAL SIGNS:  Respirations 16, pulse 72, blood  pressure 120/70.  GENERAL:  He is well appearing, no acute distress.  HEENT:  Normocephalic, atraumatic.  CARDIOVASCULAR:  Regular rate and rhythm.  LUNGS:  Clear to auscultation bilaterally.  ABDOMEN:  Soft.  EXTREMITIES:  The left arm fistula has thrombosed.  He has a palpable  radial pulse.  There is no swelling in the left arm.  The right arm has  palpable radial pulse.  NEUROLOGIC:  The patient has a dense hemiparesis on the left side.   DIAGNOSED STUDIES:  The patient underwent cephalic vein mapping.  He  does not have a cephalic vein with adequate diameter for fistula.   ASSESSMENT/PLAN:  A 70 year old in need of long-term dialysis access in  the setting of a thrombosed left-sided fistula.  Plan:  Given the  patient's limited function of his left side, we would prefer to keep  his  access on his left side.  Unfortunately, his venogram at the time of his  lysis study reveals a possible central vein occlusion beginning in a  subclavian vein.  There is very limited imaging of these veins on this  study and, therefore, I would like to repeat his venogram to see if it  is at all possible to perform a procedure on his left side, given that  this is the side that has been affected by the stroke.  I have scheduled  the patient for a left arm venogram.  This will be done this coming  Thursday, November 13.  Based on these results, the patient will be  scheduled for a Gore-Tex graft placement on Friday, November 21.  This  will either be a left upper arm arc graft or a right-sided graft.   Jorge Ny, MD  Electronically Signed   VWB/MEDQ  D:  12/30/2006  T:  12/31/2006  Job:  [id number]   cc:   Fayrene Fearing L. Deterding, M.D.

## 2010-07-04 NOTE — Op Note (Signed)
[name], [name] NO.:  [id number]   MEDICAL RECORD NO.:  [id number]          PATIENT TYPE:  OIB   LOCATION:  0098                         [facility]ACILITY:  [facility]   PHYSICIAN:  Bertram Millard. Dahlstedt, M.D.DATE OF BIRTH:  Dec 30, 1940   DATE OF PROCEDURE:  05/23/2007  DATE OF DISCHARGE:                               OPERATIVE REPORT   PREOPERATIVE DIAGNOSIS:  Penile mass.   POSTOPERATIVE DIAGNOSIS:  Penile mass.   PROCEDURE:  1. Circumcision.  2. Excision of penile mass.   ATTENDING PHYSICIAN:  Bertram Millard. Dahlstedt, M.D.   RESIDENT PHYSICIAN:  Dr. Flora Lipps .   ANESTHESIA:  Local with MAC anesthesia.   INDICATIONS FOR PROCEDURE:  [name] is a 70 year old African American  male with past medical history positive for end-stage renal disease  currently on dialysis Tuesday, Thursday and Saturday.  He makes minimal  if any urine and has erectile dysfunction.  He presented to the  emergency department a short time ago complaining of bleeding from a  penile lesion.  On exam it appeared highly suspicious for squamous cell  car and likewise Dr. Retta Diones discussed the need to take him to the  operating room for excision of this lesion.  The risks, consequences and  benefits were discussed with him at great length.  Informed consent was  obtained preoperatively.   PROCEDURE IN DETAIL:  The patient is brought to the operating room and  placed in supine position.  He was correctly identified by his wristband  and appropriate time-out was taken.  IV antibiotics were administered  and MAC anesthesia and local anesthesia was also delivered.  Once  adequately anesthetized his groin was prepped and draped sterilely.  We  began our procedure by performing a circumcision.  This was done with a  circumcising incision in the area uninvolved with the tumor.  The tumor  was on the ventral surface of the penis overlying the frenulum.  Our  circumcising incision was then carried  out to circumscribe the lesion  leaving a margin of at least 5 mm from the base of the lesion to our  incision line.  We took great care in not using electrocautery around  our margin edge.  Once this incision was made we then made our proximal  circumcising incision and subsequently removed an intervening segment of  foreskin.  We then took great care in dissecting the lesion off of the  urethra.  Once the lesion was completely freed up it was passed off of  the table.  Close inspection was paid.  There was no urethral injury.  We then evaluated what was left of his glans and foreskin and felt that  in an effort to achieve adequate closure we incised the dorsal aspect of  his foreskin which would allow [place] to create skin wings to wrap around  the glans similar to what one would do in a hypospadias repair.  This  gave [place] enough skin to cover the large ventral defect entirely without  any tension.  Again, prior to closure all bleeding sites were identified  and coagulated.  We then closed our wound.  One simple interrupted  suture and one horizontal mattress suture were placed at 12 and 6  o'clock respectively with 4-0 chromic.  Similar interrupted sutures were  placed at 3 and 9 o'clock.  We then closed the entire incision with  running suture of 4-0 chromic in all four quadrants.  Once this was done  a final inspection  was made.  There was no hematoma, there was no active bleeding and an  acceptable cosmetic result.  Pressure dressing was applied and this  marked the end of our procedure.  There were no complications.  He  tolerated the procedure well.  Dr. Retta Diones was present and  participated in all aspects of the case.     ______________________________  Littie Deeds, Dr      Bertram Millard. Dahlstedt, M.D.  Electronically Signed    DU/MEDQ  D:  05/23/2007  T:  05/23/2007  Job:  [id number]

## 2010-07-04 NOTE — Assessment & Plan Note (Signed)
OFFICE VISIT   [name], [name]  DOB:  28-Jan-1941                                       09/29/2007  WGNFA#:[id number]   REASON FOR VISIT:  Dialysis access.   HISTORY:  A 70 year old gentleman who underwent left upper arm graft  performed on 01/10/2007.  The patient has recently been having trouble  with flow and had a graft study which reveals occlusion of the brachial  vein used for his graft, with retrograde filling of the peri-brachial  vein which is patent centrally.  There is a stenosis right where the  back-filling occurs.   EXAM:  The patient does have a palpable thrill, he is in no acute  distress.  His blood pressure is 105/66, as pulse is 66.   DIAGNOSTIC STUDIES:  I reviewed the patient's venogram day, myself and  with the patient.   ASSESSMENT/PLAN:  Outflow vein occlusion.   PLAN:  I have discussed with the patient that, given he is having  difficulty with flows and dialysis, that we should revise his graft.  This would consist of opening the incision in his axilla, taking down  his venous anastomosis and plugging the graft into another brachial  vein, which by venogram is widely patent.  This has been scheduled for  Friday, August 20th.   Jorge Ny, MD  Electronically Signed   VWB/MEDQ  D:  09/29/2007  T:  09/30/2007  Job:  [id number]   cc:   Fayrene Fearing L. Deterding, M.D.

## 2010-07-04 NOTE — Op Note (Signed)
[name], [name]               ACCOUNT NO.:  [id number]   MEDICAL RECORD NO.:  [id number]          PATIENT TYPE:  AMB   LOCATION:  SDS                          FACILITY:  [facility]   PHYSICIAN:  Quita Skye. Hart Rochester, M.D.  DATE OF BIRTH:  1940/10/02   DATE OF PROCEDURE:  10/04/2007  DATE OF DISCHARGE:                               OPERATIVE REPORT   PREOPERATIVE DIAGNOSES:  End-stage renal disease; thrombosed  arteriovenous graft, left upper arm.   POSTOPERATIVE DIAGNOSES:  End-stage renal disease; thrombosed  arteriovenous graft, left upper arm.   OPERATION:  Thrombectomy of arteriovenous graft, left upper arm with  revision into a parallel axillary vein.   SURGEON:  Quita Skye. Hart Rochester, MD   FIRST ASSISTANT:  Nurse.   ANESTHESIA:  Local.   PROCEDURE:  The patient was taken to the operating room and placed in  supine position, at which time the left upper extremity was prepped with  Betadine scrub and solution and draped in routine sterile manner.  After  infiltration of 1% Xylocaine, a longitudinal incision was made near the  axilla over the Gore-Tex axillary vein anastomosis.  This was known to  be totally occluded proximally by preoperative venogram, and there was a  parallel vein of equal size.  The previous anastomosis was taken down  and the deeper vein was located, mobilized, ligated distally, and  transected.  Graft was transected, easily thrombectomized, and excellent  flow being reestablished.  The graft was then anastomosed end-to-end to  the vein with 6-0 Prolene.  Clamps released.  There was an excellent  pulse and thrill in the graft.  No protamine was given and no heparin  was given.  Wound was irrigated with saline and closed in layers with  Vicryl in subcuticular fashion.  Sterile dressing applied.  The patient  was taken to recovery room in satisfactory condition.      Quita Skye Hart Rochester, M.D.  Electronically Signed     JDL/MEDQ  D:  10/04/2007  T:  10/05/2007   Job:  [id number]

## 2010-07-07 NOTE — Op Note (Signed)
   NAME:  [name], [name]                         ACCOUNT NO.:  [id number]   MEDICAL RECORD NO.:  [id number]                   PATIENT TYPE:  OIB   LOCATION:  2899                                 FACILITY:  [facility]   PHYSICIAN:  Di Kindle. Edilia Bo, M.D.        DATE OF BIRTH:  1940/03/07   DATE OF PROCEDURE:  07/28/2002  DATE OF DISCHARGE:                                 OPERATIVE REPORT   PREOPERATIVE DIAGNOSIS:  Chronic renal failure.   POSTOPERATIVE DIAGNOSIS:  Chronic renal failure.   PROCEDURE:  Placement of left upper arm arteriovenous fistula.   SURGEON:  Di Kindle. Edilia Bo, M.D.   ASSISTANT:  Nurse.   ANESTHESIA:  Local with sedation.   TECHNIQUE:  The patient was taken to the operating room and sedated by  anesthesia.  The entire left upper extremity was prepped and draped in the  usual sterile fashion.  After the skin was infiltrated with 1% lidocaine, a  transverse incision was made at the antecubital level.  Here the cephalic  vein was dissected free and was a nice-sized vein.  The artery was also  quite large, and it was dissected free beneath the fascia and controlled  with a vessel loop.  The patient was heparinized with 5000 units of IV  heparin.  The vein was ligated distally and then irrigated up with  heparinized saline.  It was mobilized over for anastomosis to the brachial  artery.  The brachial artery was clamped proximally and distally and a  longitudinal arteriotomy was made.  The vein was sewn end-to-side to the  artery using continuous 6-0 Prolene suture.  At the completion there was an  excellent thrill in the fistula and a palpable radial pulse.  Hemostasis was  obtained in the wound.  The wound was closed with a deep layer of 3-0 Vicryl  and the skin closed with 4-0 Vicryl.  A sterile dressing was applied.  The  patient tolerated the procedure well and was transferred to the recovery  room in satisfactory condition.  All needle and sponge counts  were correct.                                               Di Kindle. Edilia Bo, M.D.    CSD/MEDQ  D:  07/28/2002  T:  07/28/2002  Job:  [id number]

## 2010-07-07 NOTE — Op Note (Signed)
NAME:  [name], [name]               ACCOUNT NO.:  [id number]   MEDICAL RECORD NO.:  [id number]          PATIENT TYPE:  AMB   LOCATION:  CARD                         FACILITY:  [facility]   PHYSICIAN:  Rose Phi. Myna Hidalgo, M.D. DATE OF BIRTH:  May 12, 1940   DATE OF PROCEDURE:  03/13/2005  DATE OF DISCHARGE:                                 OPERATIVE REPORT   NATURE OR PROCEDURE:  Left posterior iliac crest bone marrow biopsy and  aspirate.   [name] was brought to the short stay unit. He had an IV placed in his  right hand.   He was then placed onto his right side. He received a total of 2.5 mg of  Versed and 12.5 mg of Demerol for IV sedation.   The posterior iliac crest region was prepped and draped in a sterile  fashion. He had 10 mL of 2% lidocaine infiltrated onto the skin down to the  periosteum. A #11 scalpel was used to make an incision into the skin. Two  bone marrow aspirates were obtained without difficulty. A second incision  was made into the skin. A good bone narrow biopsy core was obtained without  difficulty.   The patient tolerated the procedure well. There were no complications.      Rose Phi. Myna Hidalgo, M.D.  Electronically Signed     PRE/MEDQ  D:  03/13/2005  T:  03/13/2005  Job:  [id number]

## 2010-07-07 NOTE — Op Note (Signed)
   NAME:  [name], [name]                         ACCOUNT NO.:  [id number]   MEDICAL RECORD NO.:  [id number]                   PATIENT TYPE:  AMB   LOCATION:  ENDO                                 FACILITY:  [facility]   PHYSICIAN:  Anselmo Rod, M.D.               DATE OF BIRTH:  1940-05-31   DATE OF PROCEDURE:  11/06/2002  DATE OF DISCHARGE:  11/06/2002                                 OPERATIVE REPORT   PROCEDURE:  Esophagogastroduodenoscopy.   ENDOSCOPIST:  Anselmo Rod, M.D.   INSTRUMENT USED:  Olympus video panendoscope.   INDICATIONS FOR PROCEDURE:  Anemia with Guaiac positive stools in a 70-year-  old African American male, rule out peptic ulcer disease, esophagitis,  gastritis, etc.   PREPROCEDURE PREPARATION:  Informed consent was obtained from the patient.  The patient was  fasted for 8 hours prior to the procedure.   PREPROCEDURE PHYSICAL:  The patient had stable vital signs, neck supple,  chest clear to auscultation, S1, S2 regular, abdomen soft with normal bowel  sounds.   DESCRIPTION OF PROCEDURE:  The patient was placed in the left lateral  decubitus position and sedated with Demerol and Versed for the colonoscopy.  Once the patient was adequately sedated and maintained on low flow oxygen  and continuous cardiac monitoring, the Olympus video panendoscope was  advanced through the mouth piece over the tongue into the esophagus under  direct visualization.   The entire esophagus appeared normal with no evidence of rings, stricture,  masses, esophagitis or Barrett's mucosa. A small  hiatal hernia was seen on  high retroflexion. There was evidence of atrophic gastritis. No masses or  polyps were seen. The proximal  small bowel appeared  normal.   IMPRESSION:  1. Normal esophagus and small bowel.  2. Atrophic gastritis with small  hiatal hernia.  3. No ulcers or masses were seen.    RECOMMENDATIONS:  1. Continue serial CBCs.  2. Outpatient follow up in the  next  2 weeks for further recommendations.                                                Anselmo Rod, M.D.    JNM/MEDQ  D:  11/09/2002  T:  11/09/2002  Job:  [id number]   cc:   Fayrene Fearing L. Deterding, M.D.  [address]  [place]  [place] [address]  Fax: [phone]

## 2010-07-07 NOTE — Op Note (Signed)
NAME:  [name], [name]                         ACCOUNT NO.:  [id number]   MEDICAL RECORD NO.:  [id number]                   PATIENT TYPE:  AMB   LOCATION:  ENDO                                 FACILITY:  [facility]   PHYSICIAN:  Anselmo Rod, M.D.               DATE OF BIRTH:  07-05-1940   DATE OF PROCEDURE:  11/06/2002  DATE OF DISCHARGE:  11/06/2002                                 OPERATIVE REPORT   PROCEDURE:  Colonoscopy.   ENDOSCOPIST:  Anselmo Rod, M.D.   INSTRUMENT USED:  Olympus video colonoscope.   INDICATIONS FOR PROCEDURE:  Guaiac positive stools and anemia in a 70-year-  old African American male, hemoglobin  down to 8.5 gm per dL.  Rule out  colonic polyps, masses, etc.   PREPROCEDURE PREPARATION:  Informed consent was obtained from the patient  and the patient was  fasted for 8 hours prior to  the procedure and prepped  with a gallon of GoLYTELY the  night prior to the procedure.   PREPROCEDURE PHYSICAL:  The patient had stable vital signs. Neck supple.  Chest clear to auscultation, S1, S2 regular. Abdomen soft with normoactive  bowel sounds. 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   DESCRIPTION OF PROCEDURE:  The patient was placed in the left lateral  decubitus position and sedated with 50 mg of Demerol and 6 mg of Versed  intravenously. Once sedation was adequate, the patient was maintained on low  flow oxygen and continuous cardiac monitoring.   The Olympus video colonoscope was advanced from the rectum to the cecum with  difficulty. There was a significant amount of residual stool in the colon.  Multiple  washings were done. There was evidence of pandiverticulosis with  inspissated stool in several of the diverticula.  No masses or polyps could  be identified . The procedure was complete up to the cecum, and the  appendiceal orifice and ileocecal valve were visualized. The patient  tolerated the procedure well without complications.  Retroflexion in the  rectum revealed no abnormalities.   IMPRESSION:  1. Pandiverticulosis.  2. No masses or polyps seen.  3. A significant amount of residual stool in the colon. Small lesions could     have been missed.    RECOMMENDATIONS:  Proceed with an EGD at this time. Further  recommendations  will be made thereafter.   RECOMMENDATIONS:                                               Anselmo Rod, M.D.    JNM/MEDQ  D:  11/07/2002  T:  11/08/2002  Job:  [id number]   cc:   Fayrene Fearing L. Deterding, M.D.  [address]  [place]  [place] [address]  Fax: [phone]

## 2010-07-26 ENCOUNTER — Ambulatory Visit (HOSPITAL_BASED_OUTPATIENT_CLINIC_OR_DEPARTMENT_OTHER)
Admission: RE | Admit: 2010-07-26 | Discharge: 2010-07-26 | Disposition: A | Discharge status: Home / Self Care | Payer: Medicare Other | Source: Ambulatory Visit | Attending: Urology | Admitting: Urology

## 2010-07-26 ENCOUNTER — Other Ambulatory Visit: Payer: Self-pay | Admitting: Urology

## 2010-07-26 DIAGNOSIS — Z79899 Other long term (current) drug therapy: Secondary | ICD-10-CM | POA: Insufficient documentation

## 2010-07-26 DIAGNOSIS — E7800 Pure hypercholesterolemia, unspecified: Secondary | ICD-10-CM | POA: Insufficient documentation

## 2010-07-26 DIAGNOSIS — D074 Carcinoma in situ of penis: Secondary | ICD-10-CM | POA: Insufficient documentation

## 2010-07-26 DIAGNOSIS — I120 Hypertensive chronic kidney disease with stage 5 chronic kidney disease or end stage renal disease: Secondary | ICD-10-CM | POA: Insufficient documentation

## 2010-07-26 DIAGNOSIS — Z8673 Personal history of transient ischemic attack (TIA), and cerebral infarction without residual deficits: Secondary | ICD-10-CM | POA: Insufficient documentation

## 2010-07-26 DIAGNOSIS — N186 End stage renal disease: Secondary | ICD-10-CM | POA: Insufficient documentation

## 2010-07-26 HISTORY — PX: OTHER SURGICAL HISTORY: SHX169

## 2010-08-07 ENCOUNTER — Other Ambulatory Visit (HOSPITAL_COMMUNITY): Payer: Self-pay | Admitting: Nephrology

## 2010-08-07 DIAGNOSIS — N186 End stage renal disease: Secondary | ICD-10-CM

## 2010-08-11 ENCOUNTER — Other Ambulatory Visit (HOSPITAL_COMMUNITY): Payer: Self-pay | Admitting: Nephrology

## 2010-08-11 ENCOUNTER — Ambulatory Visit (HOSPITAL_COMMUNITY)
Admission: RE | Admit: 2010-08-11 | Discharge: 2010-08-11 | Disposition: A | Discharge status: Home / Self Care | Payer: Medicare Other | Source: Ambulatory Visit | Attending: Nephrology | Admitting: Nephrology

## 2010-08-11 DIAGNOSIS — M109 Gout, unspecified: Secondary | ICD-10-CM | POA: Insufficient documentation

## 2010-08-11 DIAGNOSIS — Y832 Surgical operation with anastomosis, bypass or graft as the cause of abnormal reaction of the patient, or of later complication, without mention of misadventure at the time of the procedure: Secondary | ICD-10-CM | POA: Insufficient documentation

## 2010-08-11 DIAGNOSIS — D649 Anemia, unspecified: Secondary | ICD-10-CM | POA: Insufficient documentation

## 2010-08-11 DIAGNOSIS — E119 Type 2 diabetes mellitus without complications: Secondary | ICD-10-CM | POA: Insufficient documentation

## 2010-08-11 DIAGNOSIS — N186 End stage renal disease: Secondary | ICD-10-CM | POA: Insufficient documentation

## 2010-08-11 DIAGNOSIS — I120 Hypertensive chronic kidney disease with stage 5 chronic kidney disease or end stage renal disease: Secondary | ICD-10-CM | POA: Insufficient documentation

## 2010-08-11 DIAGNOSIS — H4310 Vitreous hemorrhage, unspecified eye: Secondary | ICD-10-CM | POA: Insufficient documentation

## 2010-08-11 DIAGNOSIS — T82898A Other specified complication of vascular prosthetic devices, implants and grafts, initial encounter: Secondary | ICD-10-CM | POA: Insufficient documentation

## 2010-08-11 DIAGNOSIS — Z8673 Personal history of transient ischemic attack (TIA), and cerebral infarction without residual deficits: Secondary | ICD-10-CM | POA: Insufficient documentation

## 2010-08-11 DIAGNOSIS — N2581 Secondary hyperparathyroidism of renal origin: Secondary | ICD-10-CM | POA: Insufficient documentation

## 2010-08-11 MED ORDER — IOHEXOL 300 MG/ML  SOLN
100.0000 mL | Freq: Once | INTRAMUSCULAR | Status: AC | PRN
  Administered 2010-08-11: 50 mL via INTRAVENOUS

## 2010-08-16 NOTE — Op Note (Signed)
NAMEMarland Kitchen  [name], [name] NO.:  [id number]  MEDICAL RECORD NO.:  [id number]  LOCATION:                                 FACILITY:  PHYSICIAN:  Lawrence Millard. 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  SURGICAL PROCEDURES:  Penile biopsy, carbon dioxide laser of penile carcinoma in situ.  SURGEON:  Lawrence Millard. [name], M.D.  ANESTHESIA:  Local with MAC.  SPECIMEN:  Penile biopsy to pathology.  ESTIMATED BLOOD LOSS:  Minimal.  COMPLICATIONS:  None.  BRIEF HISTORY:  [name] is a 70 year old male with end-stage renal disease, on hemodialysis.  He is followed by Dr. Fayrene Fearing Cox.  He is seeing me for at least a couple of years with penile lesions.  Upon original presentation, he had significant carcinoma and underwent circumcision and penile biopsy.  This revealed noninvasive squamous cell carcinoma with carcinoma in situ.  He has had persistence of the disease and has been treated with topical Imiquad.  Despite, by report, usual application of this medication by the patient, he has persistence of the superficial lesion along the coronal ridge.  He presents at this time for definitive excision as well as definitive treatment with CO2 laser, as well as excisional biopsy.  Risks and complications of the procedure have been discussed with the patient.  This will be done under local anesthesia with MAC.  DESCRIPTION OF PROCEDURE:  The patient was identified in the holding area and received preoperative IV antibiotics.  He was taken to the operating room where IV sedation was given.  Genitalia and perineum were prepped and draped.  I then infiltrated 20 cc of 50:50 solution of 0.5% plain Marcaine and 1% plain lidocaine at the  base of his penis in the dorsal midline.  This gave [place] an adequate block.  Additionally, some of this 20 cc was used to infiltrate the coronal ridge.  It was evident that there was a velvety lesion extending from about 12 o'clock in a clockwise direction to about the 5 o'clock position on the coronal ridge.  The 12 o'clock area was biopsied, approximately 1 cm wide and 1 cm long piece was sent as "penile biopsy."  The rest of the lesion was treated with CO2 laser.  A couple of satellite lesions were present on the glans penis.  These were also treated with CO2 laser.  Laser was utilized to burn through the epidermis.  Following adequate ablation of the lesion, the base of the biopsy site was cauterized.  I then close the biopsy site with a running 4-0 chromic placed in a simple fashion. Hemostasis was excellent.  I placed a Vaseline gauze, as well as a 1- inch Kling and then Coban wrap.  At this point, the procedure was terminated.  The patient was then taken to PACU in stable condition.  I will send the patient home on Neosporin ointment a couple of times a day.  He was given Vicodin and will  come back to see me on August 28, 2010. I will call him with the biopsy results.     Lawrence Millard. [name], M.D.     SMD/MEDQ  D:  07/26/2010  T:  07/26/2010  Job:  [id number]  cc:   Lawrence Fearing L. Cox, M.D. Fax: [phone]  Electronically Signed by [name] M.D. on 08/16/2010 02:42:37 PM

## 2010-11-13 LAB — CBC
HCT: 30.8 — ABNORMAL LOW
Hemoglobin: 10.1 — ABNORMAL LOW
MCHC: 32.8
MCV: 89.8
RBC: 3.43 — ABNORMAL LOW

## 2010-11-13 LAB — DIFFERENTIAL
Basophils Relative: 1
Eosinophils Absolute: 0.3
Eosinophils Relative: 5
Lymphs Abs: 1.2
Monocytes Absolute: 0.5
Monocytes Relative: 8
Neutrophils Relative %: 68

## 2010-11-13 LAB — POCT I-STAT, CHEM 8
HCT: 33 — ABNORMAL LOW
Hemoglobin: 11.2 — ABNORMAL LOW
Potassium: 3.7
Sodium: 138
TCO2: 32

## 2010-11-13 LAB — TYPE AND SCREEN: Antibody Screen: NEGATIVE

## 2010-11-13 LAB — ABO/RH: ABO/RH(D): O POS

## 2010-11-13 LAB — OCCULT BLOOD X 1 CARD TO LAB, STOOL: Fecal Occult Bld: POSITIVE

## 2010-11-14 LAB — BASIC METABOLIC PANEL
Calcium: 9.8
GFR calc non Af Amer: 16 — ABNORMAL LOW
Glucose, Bld: 82
Potassium: 3.7
Sodium: 139

## 2010-11-14 LAB — HEMOGLOBIN AND HEMATOCRIT, BLOOD: Hemoglobin: 9.8 — ABNORMAL LOW

## 2010-11-20 LAB — BASIC METABOLIC PANEL
CO2: 27
Calcium: 9.5
Creatinine, Ser: 6.71 — ABNORMAL HIGH
GFR calc Af Amer: 10 — ABNORMAL LOW
GFR calc non Af Amer: 8 — ABNORMAL LOW
Glucose, Bld: 82
Sodium: 140

## 2010-11-20 LAB — HEMOGLOBIN AND HEMATOCRIT, BLOOD: HCT: 46

## 2010-11-26 IMAGING — XA IR AV DIALYSIS SHUNT INTRO NEEDLE *L*
1 series · 12 of 24 positions shown · non-contrast
Comparison: 05/11/2009

CLINICAL DATA: Decreased access flow rates during hemodialysis via
left upper arm graft.

1.  DIALYSIS AV SHUNTOGRAM
2.  ANGIOPLASTY OF LEFT AXILLARY VEIN
3.  CENTRAL VENOUS ANGIOPLASTY OF LEFT SUBCLAVIAN VEIN

[Series 1: run · 12 of 37 slices shown]
[im 2/37]
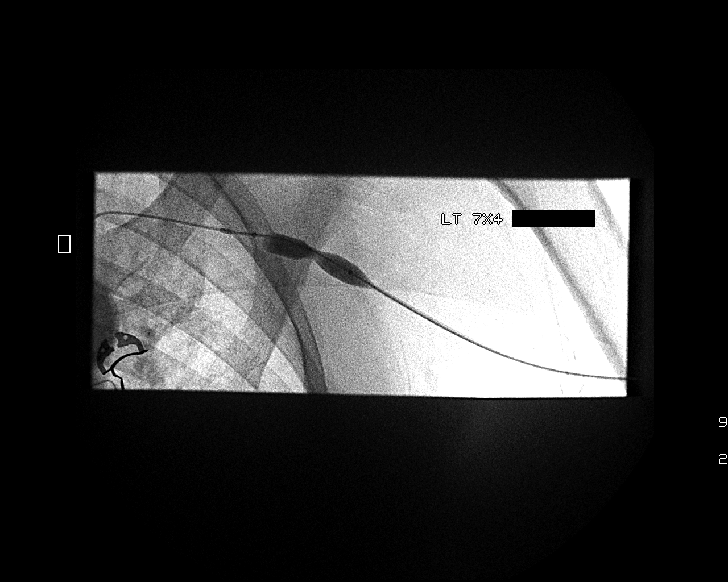
[im 5/37]
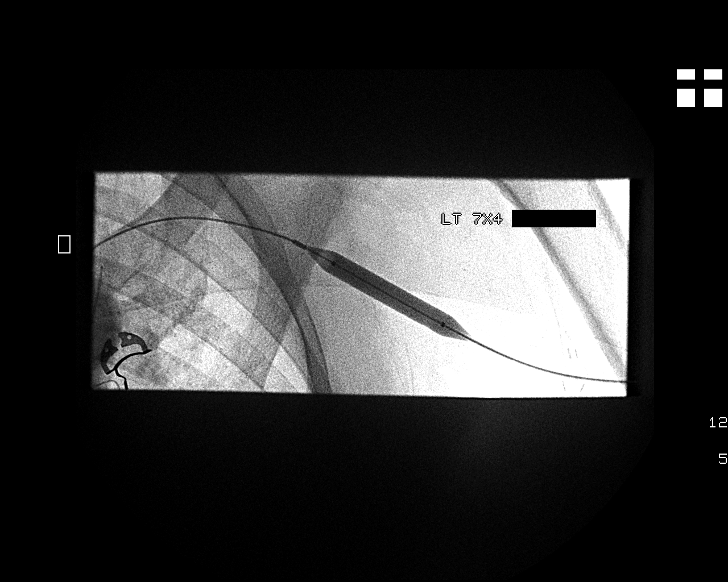
[im 8/37]
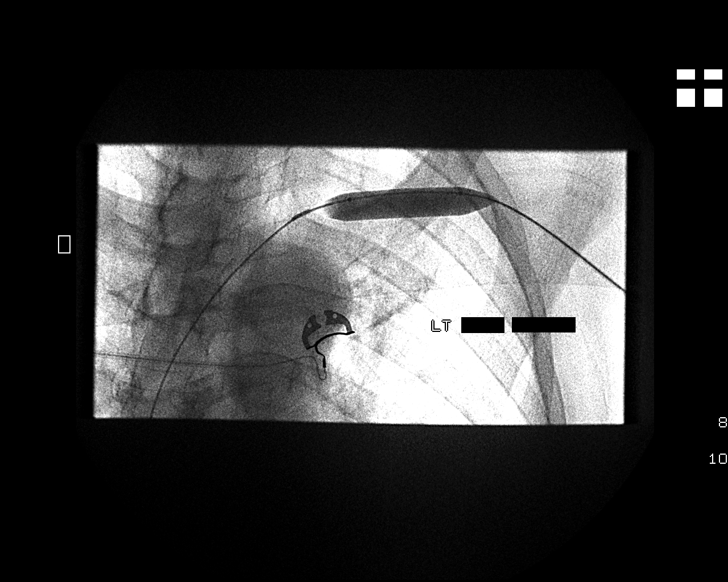
[im 11/37]
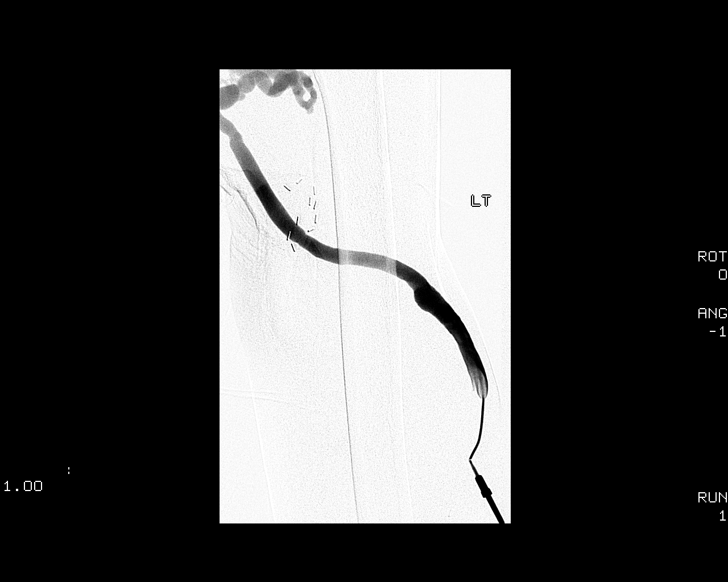
[im 15/37]
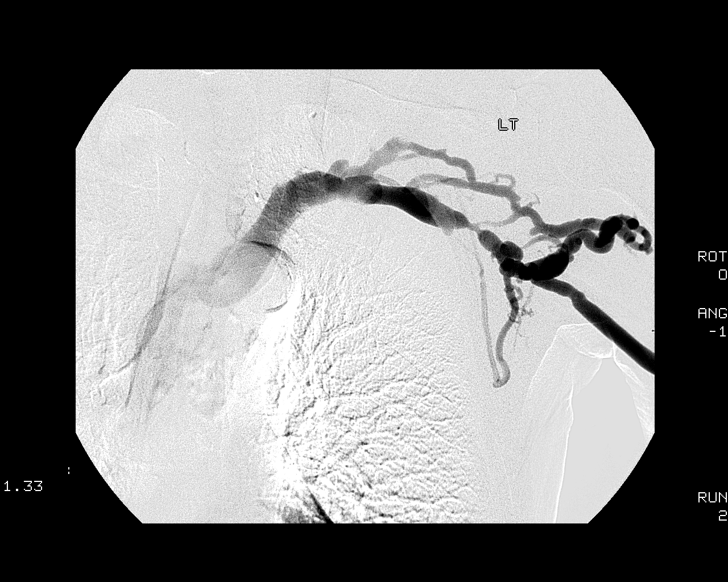
[im 18/37]
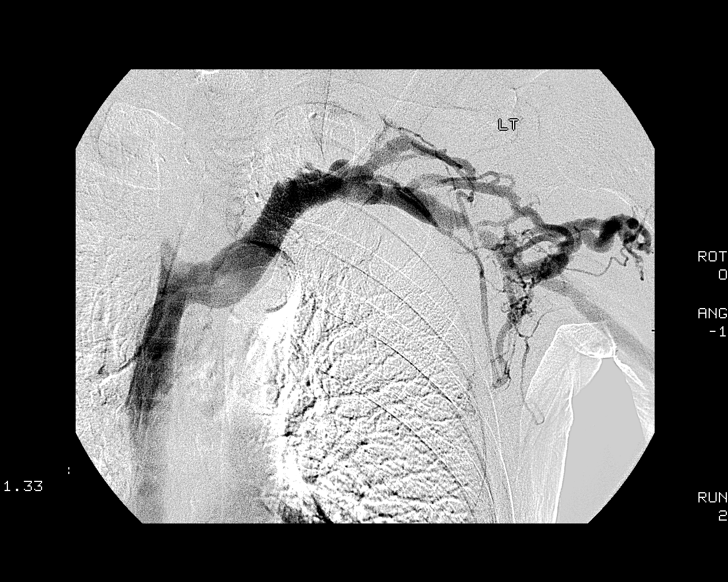
[im 21/37]
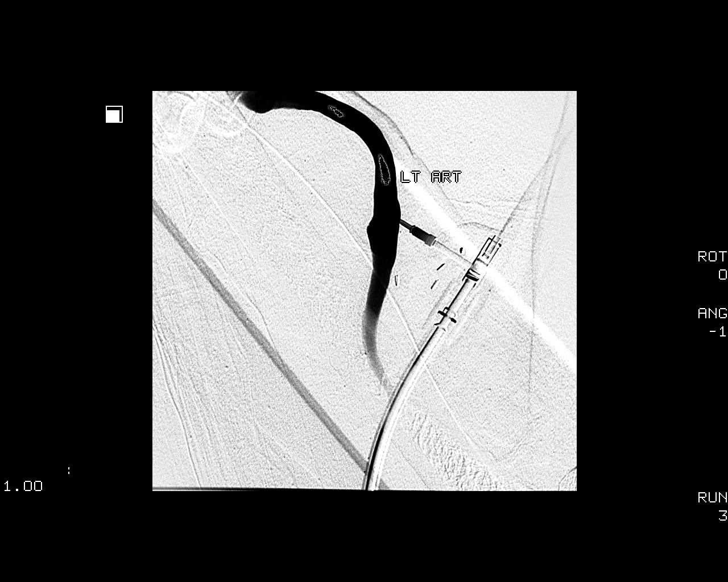
[im 24/37]
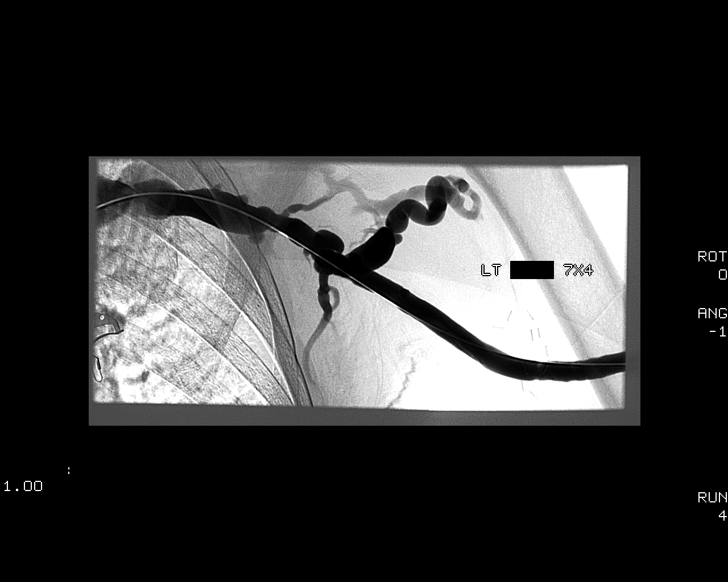
[im 27/37]
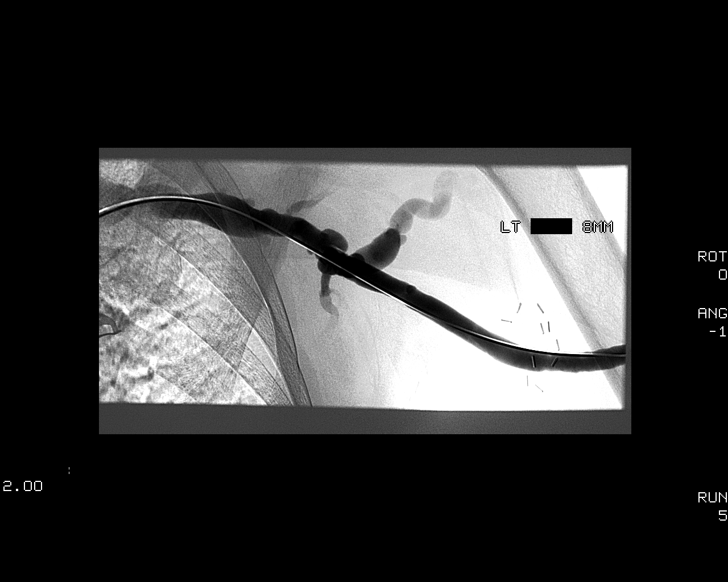
[im 30/37]
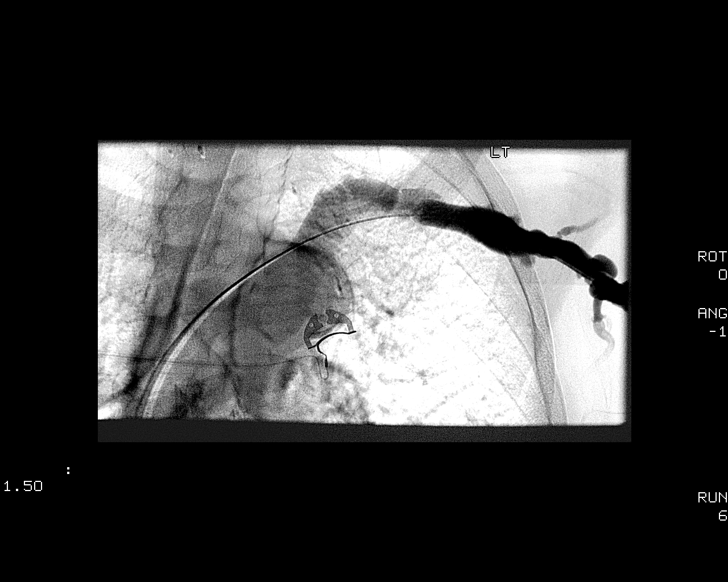
[im 33/37]
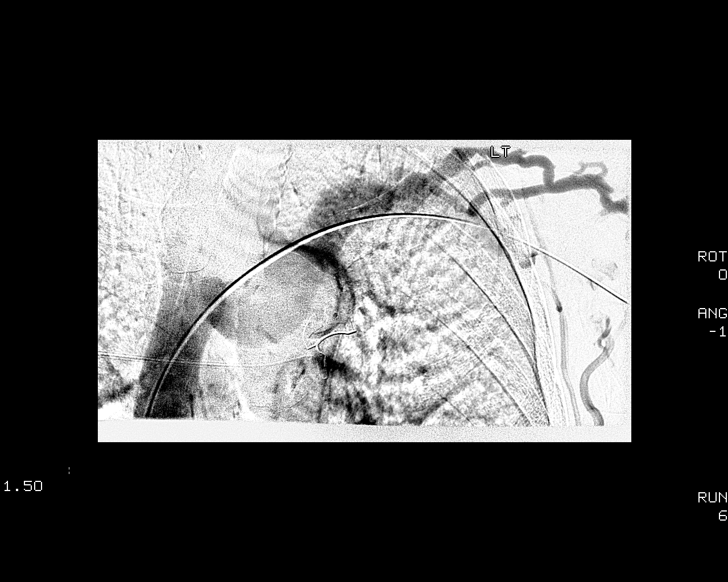
[im 37/37]
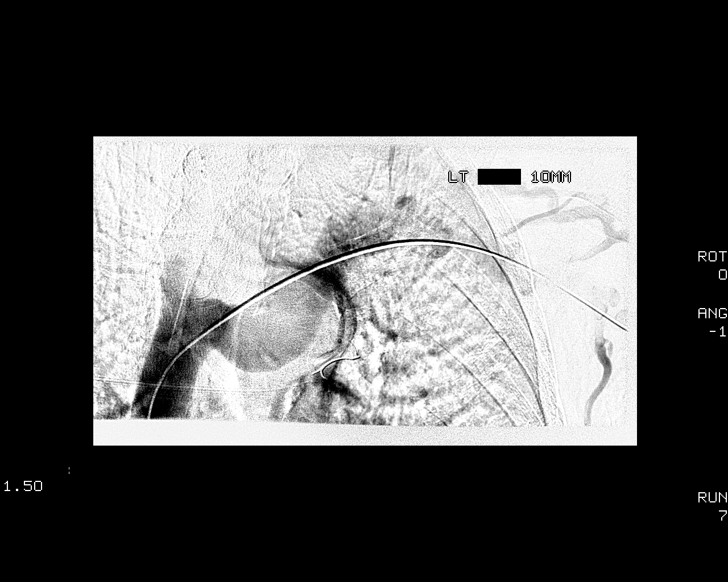

[12 of 24 positions shown; findings below may reference images not displayed]

Contrast:  45 ml Smnipaque-[facility]

Fluoroscopy Time: 3.7 minutes.

Procedure:  The procedure, risks, benefits, and alternatives were
explained to the patient.  Questions regarding the procedure were
encouraged and answered.  The patient understands and consents to
the procedure.

The left arm dialysis graft was prepped with Betadine in a sterile
fashion, and a sterile drape was applied covering the operative
field.  A diagnostic shunt study was performed via an 18 gauge
angiocatheter introduced into venous outflow.  Venous drainage was
assessed to the level of the central veins in the chest.  Proximal
shunt was studied by reflux maneuver with temporary compression of
venous outflow.

The angiocath was removed and replaced with a 6-French sheath over
a guidewire.  Balloon angioplasty of the axillary vein was then
performed with a 7 mm x 4 cm Conquest balloon.  Balloon angioplasty
was followed by additional angiography.  Additional balloon
dilatation of the axillary vein was performed with an 8 mm x 4 cm
Conquest balloon.  Angiography was performed.

Balloon dilatation of the left subclavian vein was performed with a
10 mm x 4 cm [place] balloon.  Angiography was performed after
angioplasty.

The sheath was removed and hemostasis obtained with application of
a 2-0 Ethilon pursestring suture.

Complications: None
FINDINGS: There is a recurrent venous outflow stenosis of the
axillary vein approaching 90% in caliber and associated with
prominent filling of bridging collateral veins.  Adjacent areas of
irregular narrowing of the vein were also present.  The graft
itself is normally patent.  Mild stenosis at the venous anastomosis
approaches 30%.

The axillary venous outflow as well as venous anastomosis were
dilated with a 7 mm balloon resulting in improved appearance.  A
segment of axillary vein was also dilated to 8 mm.  Final result
shows improved patency with less prominent filling of collateral
veins.

Also evident was focal stenosis of the mid subclavian vein with
associated bridging collaterals.  This showed improvement after 10
mm balloon angioplasty with significant reduction in collateral
vein filling.  Other central venous outflow is widely patent.
IMPRESSION: High-grade recurrent venous outflow stenosis of the left axillary
vein responded well to 7 mm and 8 mm balloon angioplasty.  Venous
anastomosis was also dilated to 7 mm with improved result.  Focal
stenosis of the mid left subclavian vein responded well to 10 mm
balloon angioplasty.

Access Management: The graft and venous outflow remain amenable to
percutaneous intervention.

## 2010-11-28 LAB — POCT I-STAT 4, (NA,K, GLUC, HGB,HCT)
Glucose, Bld: 84
HCT: 47
Operator id: 181601

## 2010-11-29 LAB — POTASSIUM: Potassium: 3.9

## 2010-11-30 ENCOUNTER — Other Ambulatory Visit (HOSPITAL_COMMUNITY): Payer: Self-pay | Admitting: Nephrology

## 2010-11-30 DIAGNOSIS — N186 End stage renal disease: Secondary | ICD-10-CM

## 2010-11-30 LAB — CBC
Platelets: 122 — ABNORMAL LOW
RBC: 4.3
WBC: 2.4 — ABNORMAL LOW

## 2010-11-30 LAB — IRON AND TIBC
Iron: 41 — ABNORMAL LOW
Saturation Ratios: 20
TIBC: 205 — ABNORMAL LOW
UIBC: 164

## 2010-11-30 LAB — FERRITIN: Ferritin: 2028 — ABNORMAL HIGH (ref 22–322)

## 2010-12-01 LAB — CBC
MCHC: 32.2
Platelets: 151
RDW: 18.3 — ABNORMAL HIGH

## 2010-12-01 LAB — IRON AND TIBC
Iron: 15 — ABNORMAL LOW
Saturation Ratios: 8 — ABNORMAL LOW
TIBC: 194 — ABNORMAL LOW

## 2010-12-04 LAB — IRON AND TIBC: Iron: 53

## 2010-12-04 LAB — FERRITIN: Ferritin: 1426 — ABNORMAL HIGH (ref 22–322)

## 2010-12-04 LAB — CBC
MCHC: 32.9
MCV: 89.2
Platelets: 149 — ABNORMAL LOW
RBC: 3.55 — ABNORMAL LOW
RDW: 20.4 — ABNORMAL HIGH

## 2010-12-06 LAB — LIPID PANEL
Cholesterol: 127
HDL: 47
LDL Cholesterol: 61
Total CHOL/HDL Ratio: 2.7
Triglycerides: 97

## 2010-12-06 LAB — URINE MICROSCOPIC-ADD ON
Bacteria, UA: NONE SEEN
RBC / HPF: NONE SEEN

## 2010-12-06 LAB — URINALYSIS, ROUTINE W REFLEX MICROSCOPIC
Glucose, UA: NEGATIVE
Hgb urine dipstick: NEGATIVE
Ketones, ur: NEGATIVE
Protein, ur: NEGATIVE

## 2010-12-06 LAB — COMPREHENSIVE METABOLIC PANEL
ALT: 15
AST: 14
Albumin: 3.6
Alkaline Phosphatase: 55
BUN: 101 — ABNORMAL HIGH
Chloride: 98
GFR calc Af Amer: 14 — ABNORMAL LOW
Potassium: 3.7
Total Bilirubin: 1.4 — ABNORMAL HIGH

## 2010-12-06 LAB — CBC
HCT: 28.1 — ABNORMAL LOW
Platelets: 114 — ABNORMAL LOW
WBC: 2.7 — ABNORMAL LOW

## 2010-12-11 ENCOUNTER — Ambulatory Visit (HOSPITAL_COMMUNITY)
Admission: RE | Admit: 2010-12-11 | Discharge: 2010-12-11 | Disposition: A | Discharge status: Home / Self Care | Payer: Medicare Other | Source: Ambulatory Visit | Attending: Nephrology | Admitting: Nephrology

## 2010-12-11 ENCOUNTER — Other Ambulatory Visit (HOSPITAL_COMMUNITY): Payer: Self-pay | Admitting: Nephrology

## 2010-12-11 DIAGNOSIS — M109 Gout, unspecified: Secondary | ICD-10-CM | POA: Insufficient documentation

## 2010-12-11 DIAGNOSIS — I120 Hypertensive chronic kidney disease with stage 5 chronic kidney disease or end stage renal disease: Secondary | ICD-10-CM | POA: Insufficient documentation

## 2010-12-11 DIAGNOSIS — N186 End stage renal disease: Secondary | ICD-10-CM | POA: Insufficient documentation

## 2010-12-11 DIAGNOSIS — D649 Anemia, unspecified: Secondary | ICD-10-CM | POA: Insufficient documentation

## 2010-12-11 DIAGNOSIS — Z8549 Personal history of malignant neoplasm of other male genital organs: Secondary | ICD-10-CM | POA: Insufficient documentation

## 2010-12-11 DIAGNOSIS — Y832 Surgical operation with anastomosis, bypass or graft as the cause of abnormal reaction of the patient, or of later complication, without mention of misadventure at the time of the procedure: Secondary | ICD-10-CM | POA: Insufficient documentation

## 2010-12-11 DIAGNOSIS — T82898A Other specified complication of vascular prosthetic devices, implants and grafts, initial encounter: Secondary | ICD-10-CM | POA: Insufficient documentation

## 2010-12-11 DIAGNOSIS — N2581 Secondary hyperparathyroidism of renal origin: Secondary | ICD-10-CM | POA: Insufficient documentation

## 2010-12-11 MED ORDER — IOHEXOL 300 MG/ML  SOLN: 100.0000 mL | Freq: Once | INTRAMUSCULAR | Status: AC | PRN

## 2011-04-12 ENCOUNTER — Other Ambulatory Visit: Payer: Self-pay | Admitting: Urology

## 2011-04-20 ENCOUNTER — Encounter (HOSPITAL_BASED_OUTPATIENT_CLINIC_OR_DEPARTMENT_OTHER): Payer: Self-pay | Admitting: *Deleted

## 2011-04-20 NOTE — Progress Notes (Signed)
To [facility] at 0930.Istat,Ekg on arrival ,Ekg with chart dated.Npo after mn.

## 2011-04-24 NOTE — H&P (Signed)
Urology History and Physical Exam  CC: Penile lesion  HPI: 71 year old [place] American male, on chronic hemodialysis for end-stage renal disease. He has a history of penile carcinoma. He originally presented in 2009 with a penile mass. He underwent circumcision and excision of his penile mass in April 2009. Pathology revealed poorly differentiated squamous cell carcinoma with invasion superficially into the subepithelial connective tissue. There was also associated carcinoma in situ.  His second procedure was in October 2009 for persistence of what seem like carcinoma in the circumcision wound. He underwent carbon dioxide laser treatment of that.  In June 2011, he underwent repeat excisional biopsy of a penile lesion at his coronal sulcus with some glandular biopsies. Pathology revealed carcinoma in situ.  In June 2012, he underwent CO2 laser treatment of a lesion on his distal penile skin.  He presents at this time for laser treatment of persistent lesions.  He denies any abdominal pain, swollen glands, unplanned weight gain or weight loss, excessive swelling of his lower extremities. He does not make significant urine secondary to hemodialysis.  PMH: Past Medical History  Diagnosis Date  . ESRD (end stage renal disease)     dialysis-t/th/sat  . Hyperthyroidism   . Diverticula of intestine 03/2010  . Hyperlipemia   . Hypertension   . Constipation   . CVA (cerebral vascular accident) 1985    lt side weakness  . Gout   . History of GI bleed 2012    PSH: Past Surgical History  Procedure Date  . Circumcision 2009  . Destruction lesion penile [id number]  . Dg av dialysis graft declot or multiple times-last4/11    lt upper forearm  av graft at present    Allergies: No Known Allergies  Medications: No prescriptions prior to admission     Social History: History   Social History  . Marital Status: Single    Spouse Name: N/A    Number of Children: N/A  . Years of  Education: N/A   Occupational History  . Not on file.   Social History Main Topics  . Smoking status: Never Smoker   . Smokeless tobacco: Not on file  . Alcohol Use: No     quit 2002  . Drug Use: No  . Sexually Active:    Other Topics Concern  . Not on file   Social History Narrative  . No narrative on file    Family History: History reviewed. No pertinent family history.  Review of Systems: Positive:  Negative: .  A further 10 point review of systems was negative except what is listed in the HPI.  Physical Exam: @VITALS2 @ General: No acute distress.  Awake. Head:  Normocephalic.  Atraumatic. ENT:  EOMI.  Mucous membranes moist Neck:  Supple.  No lymphadenopathy. CV:  S1 present. S2 present. Regular rate. Pulmonary: Equal effort bilaterally.  Clear to auscultation bilaterally. Abdomen: Soft.  Non- tender to palpation. Skin:  Normal turgor.  No visible rash. Extremity: No gross deformity of bilateral upper extremities.  No gross deformity of    bilateral lower extremities. Neurologic: Alert. Appropriate mood.  Penis:  Uncircumcised. Lesions present Urethra: No Foley catheter in place.  Orthotopic meatus. Scrotum: No lesions.  No ecchymosis.  No erythema. Testicles: Descended bilaterally.  No masses bilaterally. Epididymis: Palpable bilaterally.  Non Tender to palpation.  Studies:  No results found for this basename: HGB:2,WBC:2,PLT:2 in the last 72 hours  No results found for this basename: NA:2,K:2,CL:2,CO2:2,BUN:2,CREATININE:2,CALCIUM:2,MAGNESIUM:2,GFRNONAA:2,GFRAA:2 in the last 72 hours  No results found for this basename: PT:2,INR:2,APTT:2 in the last 72 hours   No components found with this basename: ABG:2    Assessment:  Recurrent squamous cell carcinoma/carcinoma in situ of penis  Plan: For laser ablation of penile lesions

## 2011-04-25 ENCOUNTER — Ambulatory Visit (HOSPITAL_BASED_OUTPATIENT_CLINIC_OR_DEPARTMENT_OTHER): Payer: Medicare Other | Admitting: Anesthesiology

## 2011-04-25 ENCOUNTER — Encounter (HOSPITAL_BASED_OUTPATIENT_CLINIC_OR_DEPARTMENT_OTHER): Payer: Self-pay | Admitting: Anesthesiology

## 2011-04-25 ENCOUNTER — Other Ambulatory Visit: Payer: Self-pay

## 2011-04-25 ENCOUNTER — Ambulatory Visit (HOSPITAL_BASED_OUTPATIENT_CLINIC_OR_DEPARTMENT_OTHER)
Admission: RE | Admit: 2011-04-25 | Discharge: 2011-04-25 | Disposition: A | Discharge status: Home / Self Care | Payer: Medicare Other | Source: Ambulatory Visit | Attending: Urology | Admitting: Urology

## 2011-04-25 ENCOUNTER — Encounter (HOSPITAL_BASED_OUTPATIENT_CLINIC_OR_DEPARTMENT_OTHER): Payer: Self-pay | Admitting: *Deleted

## 2011-04-25 ENCOUNTER — Encounter (HOSPITAL_BASED_OUTPATIENT_CLINIC_OR_DEPARTMENT_OTHER)
Admission: RE | Disposition: A | Discharge status: Home / Self Care | Payer: Self-pay | Source: Ambulatory Visit | Attending: Urology

## 2011-04-25 DIAGNOSIS — C601 Malignant neoplasm of glans penis: Secondary | ICD-10-CM | POA: Insufficient documentation

## 2011-04-25 DIAGNOSIS — N186 End stage renal disease: Secondary | ICD-10-CM | POA: Insufficient documentation

## 2011-04-25 DIAGNOSIS — I120 Hypertensive chronic kidney disease with stage 5 chronic kidney disease or end stage renal disease: Secondary | ICD-10-CM | POA: Insufficient documentation

## 2011-04-25 DIAGNOSIS — E785 Hyperlipidemia, unspecified: Secondary | ICD-10-CM | POA: Insufficient documentation

## 2011-04-25 DIAGNOSIS — C609 Malignant neoplasm of penis, unspecified: Secondary | ICD-10-CM

## 2011-04-25 DIAGNOSIS — Z8673 Personal history of transient ischemic attack (TIA), and cerebral infarction without residual deficits: Secondary | ICD-10-CM | POA: Insufficient documentation

## 2011-04-25 DIAGNOSIS — E0590 Thyrotoxicosis, unspecified without thyrotoxic crisis or storm: Secondary | ICD-10-CM | POA: Insufficient documentation

## 2011-04-25 HISTORY — DX: Hyperlipidemia, unspecified: E78.5

## 2011-04-25 HISTORY — DX: Essential (primary) hypertension: I10

## 2011-04-25 HISTORY — DX: Personal history of other diseases of the digestive system: Z87.19

## 2011-04-25 HISTORY — PX: OTHER SURGICAL HISTORY: SHX169

## 2011-04-25 LAB — POCT I-STAT, CHEM 8
Chloride: 100 mEq/L (ref 96–112)
HCT: 41 % (ref 39.0–52.0)
Hemoglobin: 13.9 g/dL (ref 13.0–17.0)
Potassium: 4.8 mEq/L (ref 3.5–5.1)
Sodium: 142 mEq/L (ref 135–145)

## 2011-04-25 SURGERY — TREATMENT, USING YAG LASER
Anesthesia: Monitor Anesthesia Care | Site: Penis | Wound class: Clean

## 2011-04-25 MED ORDER — PROPOFOL 10 MG/ML IV EMUL
INTRAVENOUS | Status: DC | PRN
  Administered 2011-04-25: 20 mg via INTRAVENOUS

## 2011-04-25 MED ORDER — ACETIC ACID 5 % SOLN
Status: DC | PRN
  Administered 2011-04-25: 1 via TOPICAL

## 2011-04-25 MED ORDER — BUPIVACAINE HCL 0.5 % IJ SOLN
INTRAMUSCULAR | Status: DC | PRN
  Administered 2011-04-25: 10 mL

## 2011-04-25 MED ORDER — ACETAMINOPHEN 650 MG RE SUPP: 650.0000 mg | RECTAL | Status: DC | PRN

## 2011-04-25 MED ORDER — LIDOCAINE HCL 1 % IJ SOLN
INTRAMUSCULAR | Status: DC | PRN
  Administered 2011-04-25: 10 mL

## 2011-04-25 MED ORDER — CEFAZOLIN SODIUM 1-5 GM-% IV SOLN
INTRAVENOUS | Status: DC | PRN
  Administered 2011-04-25: 1 g via INTRAVENOUS

## 2011-04-25 MED ORDER — HYDROCODONE-ACETAMINOPHEN 5-325 MG PO TABS
1.0000 | ORAL_TABLET | Freq: Four times a day (QID) | ORAL | Status: AC | PRN
  Administered 2011-04-25: 1 via ORAL

## 2011-04-25 MED ORDER — FENTANYL CITRATE 0.05 MG/ML IJ SOLN
INTRAMUSCULAR | Status: DC | PRN
  Administered 2011-04-25 (×2): 25 ug via INTRAVENOUS

## 2011-04-25 MED ORDER — FENTANYL CITRATE 0.05 MG/ML IJ SOLN: 25.0000 ug | INTRAMUSCULAR | Status: DC | PRN

## 2011-04-25 MED ORDER — PROPOFOL 10 MG/ML IV EMUL
INTRAVENOUS | Status: DC | PRN
  Administered 2011-04-25: 50 ug/kg/min via INTRAVENOUS

## 2011-04-25 MED ORDER — ACETAMINOPHEN 325 MG PO TABS: 650.0000 mg | ORAL_TABLET | ORAL | Status: DC | PRN

## 2011-04-25 MED ORDER — SODIUM CHLORIDE 0.9 % IJ SOLN: 3.0000 mL | INTRAMUSCULAR | Status: DC | PRN

## 2011-04-25 MED ORDER — SODIUM CHLORIDE 0.9 % IV SOLN
INTRAVENOUS | Status: DC
  Administered 2011-04-25: 10:00:00 via INTRAVENOUS

## 2011-04-25 MED ORDER — ONDANSETRON HCL 4 MG/2ML IJ SOLN: 4.0000 mg | Freq: Four times a day (QID) | INTRAMUSCULAR | Status: DC | PRN

## 2011-04-25 MED ORDER — SODIUM CHLORIDE 0.9 % IJ SOLN: 3.0000 mL | Freq: Two times a day (BID) | INTRAMUSCULAR | Status: DC

## 2011-04-25 MED ORDER — LACTATED RINGERS IV SOLN: INTRAVENOUS | Status: DC

## 2011-04-25 MED ORDER — SODIUM CHLORIDE 0.9 % IV SOLN: 250.0000 mL | INTRAVENOUS | Status: DC | PRN

## 2011-04-25 MED ORDER — MORPHINE SULFATE 2 MG/ML IJ SOLN: 1.0000 mg | INTRAMUSCULAR | Status: DC | PRN

## 2011-04-25 MED ORDER — OXYCODONE HCL 5 MG PO TABS: 5.0000 mg | ORAL_TABLET | ORAL | Status: DC | PRN

## 2011-04-25 MED ORDER — HYDROCODONE-ACETAMINOPHEN 5-500 MG PO CAPS: 1.0000 | ORAL_CAPSULE | ORAL | Status: AC | PRN

## 2011-04-25 MED ORDER — CEFAZOLIN SODIUM 1-5 GM-% IV SOLN: 1.0000 g | INTRAVENOUS | Status: DC

## 2011-04-25 SURGICAL SUPPLY — 21 items
BANDAGE COBAN STERILE 2 (GAUZE/BANDAGES/DRESSINGS) ×2 IMPLANT
BANDAGE CONFORM 3  STR LF (GAUZE/BANDAGES/DRESSINGS) ×2 IMPLANT
CLOTH BEACON ORANGE TIMEOUT ST (SAFETY) ×3 IMPLANT
COVER TABLE BACK 60X90 (DRAPES) ×2 IMPLANT
DEPRESSOR TONGUE BLADE STERILE (MISCELLANEOUS) ×3 IMPLANT
DRAPE LG THREE QUARTER DISP (DRAPES) ×2 IMPLANT
GAUZE VASELINE 1X8 (GAUZE/BANDAGES/DRESSINGS) ×4 IMPLANT
GLOVE BIO SURGEON STRL SZ8 (GLOVE) ×3 IMPLANT
GLOVE BIOGEL M 6.5 STRL (GLOVE) ×2 IMPLANT
GLOVE ECLIPSE 6.0 STRL STRAW (GLOVE) ×2 IMPLANT
GOWN PREVENTION PLUS LG XLONG (DISPOSABLE) ×3 IMPLANT
GOWN STRL REIN XL XLG (GOWN DISPOSABLE) ×3 IMPLANT
HANDPIECE CURVED VASC (MISCELLANEOUS) ×2 IMPLANT
NDL HYPO 25X1 1.5 SAFETY (NEEDLE) IMPLANT
NEEDLE HYPO 25X1 1.5 SAFETY (NEEDLE) ×3 IMPLANT
NS IRRIG 500ML POUR BTL (IV SOLUTION) ×2 IMPLANT
PACK BASIN DAY SURGERY FS (CUSTOM PROCEDURE TRAY) ×3 IMPLANT
SYR CONTROL 10ML LL (SYRINGE) ×2 IMPLANT
TOWEL OR 17X24 6PK STRL BLUE (TOWEL DISPOSABLE) ×6 IMPLANT
VACUUM HOSE 7/8X10 W/ WAND (MISCELLANEOUS) ×3 IMPLANT
WATER STERILE IRR 500ML POUR (IV SOLUTION) ×2 IMPLANT

## 2011-04-25 NOTE — Transfer of Care (Signed)
Immediate Anesthesia Transfer of Care Note  Patient: [name]  Procedure(s) Performed: Procedure(s) (LRB): YAG LASER APPLICATION (N/A)  Patient Location: PACU  Anesthesia Type: MAC Level of Consciousness: awake, patient cooperative and responds to stimulation  Airway & Oxygen Therapy: Patient Spontanous Breathing and Patient, no O2 RA  Post-op Assessment: Report given to PACU RN, Post -op Vital signs reviewed and stable and Patient moving all extremities  Post vital signs: Reviewed and stable  Complications: No apparent anesthesia complications

## 2011-04-25 NOTE — Anesthesia Procedure Notes (Signed)
Procedure Name: MAC Date/Time: 04/25/2011 10:15 AM Performed by: Iline Oven Pre-anesthesia Checklist: Patient identified, Emergency Drugs available, Suction available, Patient being monitored and Timeout performed Patient Re-evaluated:Patient Re-evaluated prior to inductionOxygen Delivery Method: Simple face mask Preoxygenation: Pre-oxygenation with 100% oxygen

## 2011-04-25 NOTE — Anesthesia Preprocedure Evaluation (Addendum)
Anesthesia Evaluation  Patient identified by MRN, date of birth, ID band Patient awake    Reviewed: Allergy & Precautions, H&P , NPO status , Patient's Chart, lab work & pertinent test results  Airway Mallampati: II TM Distance: >3 FB Neck ROM: Full    Dental No notable dental hx.    Pulmonary neg pulmonary ROS,  breath sounds clear to auscultation  Pulmonary exam normal       Cardiovascular hypertension, Pt. on medications negative cardio ROS  Rhythm:Regular Rate:Normal     Neuro/Psych CVA (l sided weakness), Residual Symptoms negative neurological ROS  negative psych ROS   GI/Hepatic negative GI ROS, Neg liver ROS,   Endo/Other  negative endocrine ROS  Renal/GU ESRF and DialysisRenal disease (hemodialysis t/thur/sat)negative Renal ROS  negative genitourinary   Musculoskeletal negative musculoskeletal ROS (+)   Abdominal   Peds negative pediatric ROS (+)  Hematology negative hematology ROS (+)   Anesthesia Other Findings   Reproductive/Obstetrics negative OB ROS                           Anesthesia Physical Anesthesia Plan  ASA: IV  Anesthesia Plan: MAC   Post-op Pain Management:    Induction: Intravenous  Airway Management Planned: Simple Face Mask  Additional Equipment:   Intra-op Plan:   Post-operative Plan: Extubation in OR  Informed Consent: I have reviewed the patients History and Physical, chart, labs and discussed the procedure including the risks, benefits and alternatives for the proposed anesthesia with the patient or authorized representative who has indicated his/her understanding and acceptance.   Dental advisory given  Plan Discussed with: CRNA  Anesthesia Plan Comments:        Anesthesia Quick Evaluation

## 2011-04-25 NOTE — Op Note (Signed)
Preoperative diagnosis: Recurrent squamous cell carcinoma of the penis   postoperative diagnosis: Same  Procedure: YAG laser ablation of recurrent penile carcinoma  Surgeon: Aniko Finnigan  Anesthesia: Local with MAC  Complications: None  Specimens: None  Indications for procedure: This 71 year old male originally presented approximately 4 years ago with a large lesion on his uncircumcised penis. He underwent circumcision for this presumed squamous cell carcinoma. Pathology confirmed suspicions of cancer. There was no deep invasion, and he has been treated on 3 other occasions for local recurrences with either CO2 laser or excision. He presents now for another recurrence within one year of his last treatment. There is no evidence of inguinal adenopathy, but he has fairly confluent disease on his coronal area circumferentially. Because of this, I have recommended we proceed with more aggressive laser ablation, with the YAG, which will provide deeper tissue penetration. The patient agrees with the procedure and desires to proceed.  Description of procedure: The patient was identified in the holding area. He received preoperative IV antibiotics. He was taken to the operating room where he was laid in the supine position on the operating table. IV sedation was given. His penis was prepped and draped. Timeout was then performed.  Inspection of the distal penis revealed circumferential raised lesions around the coronal ridge, as well as the distal penile skin. Several small satellite lesions approximately 3 mm in size were located on the glans circumferentially as well. There was no evidence of more proximal spread past the distal 5 mm of penile shaft skin. There was no inguinal adenopathy.  I used 20 cc of a 50-50 solution of 1% plain lidocaine and quarter percent plain Marcaine to provide a dorsal penile block.  The YAG laser was then turned to 15 W. I used the large contact-tip probe on the laser, and  ablated the lesions, starting first with the lesions on the glans which were not that deep. Following this, I ablated the raised lesions both on the coronal ridge and on the distal penile skin. I used great care to get fairly deep, using a wet sponge to abrade the penis to get the charred lesions off. By doing this circumferentially around the penis, I was able to remove all raised lesions, down to the subepithelial layers. Careful inspection of the glans and the distal penile/coronal skin revealed no evident lesions remaining. I feel like I got good margins around all the lesions which were treated. Inspection of the treated areas revealed hemostasis, and I then placed Vaseline gauze, followed by a Kling, followed by a Coban wrap.  The patient tolerated the procedure well. He was awakened and taken to the PACU in stable condition.

## 2011-04-25 NOTE — Discharge Instructions (Addendum)
You may remove your dressing on Friday. Unwrap the gauze, as well as the Vaseline gauze underneath. If it sticks, you may remove it in the shower, or after applying hydrogen peroxide to loosen it up. He may experience a little bit of bleeding after the gauze comes off.  Apply Neosporin ointment to the treated area a couple times a day. He may want to keep the treated area wrapped with gauze after applying the Neosporin for a few more days.  He will be given a prescription for pain. This will probably hurt a bit more than prior treatments.  If you have any significant problems, please call our office at [phone]

## 2011-04-25 NOTE — Anesthesia Postprocedure Evaluation (Signed)
  Anesthesia Post-op Note  Patient: [name]  Procedure(s) Performed: Procedure(s) (LRB): YAG LASER APPLICATION (N/A)  Patient Location: PACU  Anesthesia Type: MAC  Level of Consciousness: awake and alert   Airway and Oxygen Therapy: Patient Spontanous Breathing  Post-op Pain: mild  Post-op Assessment: Post-op Vital signs reviewed, Patient's Cardiovascular Status Stable, Respiratory Function Stable, Patent Airway and No signs of Nausea or vomiting  Post-op Vital Signs: stable  Complications: No apparent anesthesia complications

## 2011-05-01 ENCOUNTER — Encounter (HOSPITAL_BASED_OUTPATIENT_CLINIC_OR_DEPARTMENT_OTHER): Payer: Self-pay

## 2011-05-29 ENCOUNTER — Other Ambulatory Visit (HOSPITAL_COMMUNITY): Payer: Self-pay | Admitting: Nephrology

## 2011-05-29 ENCOUNTER — Ambulatory Visit (HOSPITAL_COMMUNITY)
Admission: RE | Admit: 2011-05-29 | Discharge: 2011-05-29 | Disposition: A | Discharge status: Home / Self Care | Payer: Medicare Other | Source: Ambulatory Visit | Attending: Nephrology | Admitting: Nephrology

## 2011-05-29 DIAGNOSIS — N186 End stage renal disease: Secondary | ICD-10-CM

## 2011-05-29 DIAGNOSIS — I69998 Other sequelae following unspecified cerebrovascular disease: Secondary | ICD-10-CM | POA: Insufficient documentation

## 2011-05-29 DIAGNOSIS — Y849 Medical procedure, unspecified as the cause of abnormal reaction of the patient, or of later complication, without mention of misadventure at the time of the procedure: Secondary | ICD-10-CM | POA: Insufficient documentation

## 2011-05-29 DIAGNOSIS — R29898 Other symptoms and signs involving the musculoskeletal system: Secondary | ICD-10-CM | POA: Insufficient documentation

## 2011-05-29 DIAGNOSIS — I120 Hypertensive chronic kidney disease with stage 5 chronic kidney disease or end stage renal disease: Secondary | ICD-10-CM | POA: Insufficient documentation

## 2011-05-29 DIAGNOSIS — E785 Hyperlipidemia, unspecified: Secondary | ICD-10-CM | POA: Insufficient documentation

## 2011-05-29 DIAGNOSIS — Z992 Dependence on renal dialysis: Secondary | ICD-10-CM | POA: Insufficient documentation

## 2011-05-29 DIAGNOSIS — T82898A Other specified complication of vascular prosthetic devices, implants and grafts, initial encounter: Secondary | ICD-10-CM | POA: Insufficient documentation

## 2011-05-29 LAB — POTASSIUM: Potassium: 4.8 mEq/L (ref 3.5–5.1)

## 2011-05-29 MED ORDER — ALTEPLASE 2 MG IJ SOLR
2.0000 mg | Freq: Once | INTRAMUSCULAR | Status: AC
  Administered 2011-05-29: 2 mg
  Filled 2011-05-29: qty 2

## 2011-05-29 MED ORDER — MIDAZOLAM HCL 2 MG/2ML IJ SOLN
INTRAMUSCULAR | Status: AC
  Filled 2011-05-29: qty 6

## 2011-05-29 MED ORDER — FENTANYL CITRATE 0.05 MG/ML IJ SOLN
INTRAMUSCULAR | Status: AC | PRN
  Administered 2011-05-29 (×2): 50 ug via INTRAVENOUS

## 2011-05-29 MED ORDER — MIDAZOLAM HCL 5 MG/5ML IJ SOLN
INTRAMUSCULAR | Status: AC | PRN
  Administered 2011-05-29: 2 mg via INTRAVENOUS

## 2011-05-29 MED ORDER — HEPARIN SODIUM (PORCINE) 1000 UNIT/ML IJ SOLN
INTRAMUSCULAR | Status: AC
  Filled 2011-05-29: qty 1

## 2011-05-29 MED ORDER — FENTANYL CITRATE 0.05 MG/ML IJ SOLN
INTRAMUSCULAR | Status: AC
  Filled 2011-05-29: qty 4

## 2011-05-29 MED ORDER — IOHEXOL 300 MG/ML  SOLN
100.0000 mL | Freq: Once | INTRAMUSCULAR | Status: AC | PRN
  Administered 2011-05-29: 60 mL via INTRAVENOUS

## 2011-05-29 MED ORDER — HEPARIN SODIUM (PORCINE) 1000 UNIT/ML IJ SOLN
INTRAMUSCULAR | Status: AC | PRN
  Administered 2011-05-29: 3000 [IU] via INTRAVENOUS

## 2011-05-29 NOTE — ED Notes (Signed)
Spoke with pt's daughter in law, informed he will be ready for pick up at 1415, per dialysis center he to to return there today to finish treatment. Joni Reining verbalized understanding and willingness to comply

## 2011-05-29 NOTE — H&P (Signed)
[name] is an 71 y.o. male.   Chief Complaint: clotted left arm dialysis graft HPI: Patient with ESRD and  clotted left upper arm AVGG presents today for thrombolysis, possible angioplasty/stenting of graft or placement of new catheter if needed.   Past Medical History  Diagnosis Date  . ESRD (end stage renal disease)     dialysis-t/th/sat  . Hyperthyroidism   . Diverticula of intestine 03/2010  . Hyperlipemia   . Hypertension   . Constipation   . CVA (cerebral vascular accident) 1985    lt side weakness  . Gout   . History of GI bleed 2012    Past Surgical History  Procedure Date  . Circumcision 2009  . Destruction lesion penile [id number]  . Dg av dialysis graft declot or multiple times-last4/11    lt upper forearm  av graft at present    No family history on file. Social History:  reports that he has never smoked. He does not have any smokeless tobacco history on file. He reports that he does not drink alcohol or use illicit drugs.  Allergies: No Known Allergies  Medications Prior to Admission  Medication Sig Dispense Refill  . calcium acetate (PHOSLO) 667 MG capsule Take 667 mg by mouth 3 (three) times daily with meals.      . cinacalcet (SENSIPAR) 30 MG tablet Take 30 mg by mouth at bedtime.      . dorzolamide (TRUSOPT) 2 % ophthalmic solution 1 drop 2 (two) times daily.      . fish oil-omega-3 fatty acids 1000 MG capsule Take 2 g by mouth daily.      Marland Kitchen latanoprost (XALATAN) 0.005 % ophthalmic solution 1 drop at bedtime.      Marland Kitchen lisinopril (PRINIVIL,ZESTRIL) 20 MG tablet Take 20 mg by mouth at bedtime.      . multivitamin (RENA-VIT) TABS tablet Take 1 tablet by mouth daily.      . polyethylene glycol (MIRALAX / GLYCOLAX) packet Take 17 g by mouth daily as needed.      . simvastatin (ZOCOR) 40 MG tablet Take 40 mg by mouth every evening.      . timolol (BETIMOL) 0.5 % ophthalmic solution 1 drop 2 (two) times daily.       Medications Prior to Admission    Medication Dose Route Frequency Provider Last Rate Last Dose  . alteplase (CATHFLO ACTIVASE) injection 2 mg  2 mg Intracatheter Once Art A Hoss, MD        No results found for this or any previous visit (from the past 48 hour(s)). No results found.  Review of Systems  Constitutional: Negative for fever and chills.  Respiratory: Positive for cough. Negative for shortness of breath.   Cardiovascular: Negative for chest pain.  Gastrointestinal: Negative for nausea, vomiting and abdominal pain.  Neurological: Negative for headaches.  Endo/Heme/Allergies: Does not bruise/bleed easily.    Blood pressure 162/77, pulse 64, temperature 98.2 F (36.8 C), temperature source Oral, resp. rate 14. Physical Exam  Constitutional: He is oriented to person, place, and time.       Thin black male in no acute distress  Cardiovascular: Normal rate and regular rhythm.        Left arm AVGG with no thrill/bruit  Respiratory: Effort normal and breath sounds normal.  GI: Soft. Bowel sounds are normal.  Musculoskeletal: Normal range of motion. He exhibits no edema.  Neurological: He is alert and oriented to person, place, and time.     Assessment/Plan  Patient with ESRD and clotted left arm AVGG; plan is for thrombolysis, possible angioplasty/stenting of graft or placement of new dialysis catheter if needed. Details/risks of procedure d/w pt with his understanding and consent.  Angle Karel,D KEVIN 05/29/2011, 12:05 PM

## 2011-05-29 NOTE — ED Notes (Signed)
Sharl Ma PA notified of Potassium result of 4.8

## 2011-05-29 NOTE — Procedures (Signed)
LUE AVG declot No comp

## 2011-05-30 ENCOUNTER — Telehealth (HOSPITAL_COMMUNITY): Payer: Self-pay | Admitting: *Deleted

## 2011-05-31 ENCOUNTER — Telehealth (HOSPITAL_COMMUNITY): Payer: Self-pay | Admitting: *Deleted

## 2011-05-31 NOTE — Telephone Encounter (Signed)
Post procedure follow up call. No answer, VM left 

## 2011-09-28 ENCOUNTER — Other Ambulatory Visit (HOSPITAL_COMMUNITY): Payer: Self-pay | Admitting: Nephrology

## 2011-09-28 DIAGNOSIS — N186 End stage renal disease: Secondary | ICD-10-CM

## 2011-10-01 ENCOUNTER — Other Ambulatory Visit (HOSPITAL_COMMUNITY): Payer: Self-pay | Admitting: Nephrology

## 2011-10-01 ENCOUNTER — Ambulatory Visit (HOSPITAL_COMMUNITY)
Admission: RE | Admit: 2011-10-01 | Discharge: 2011-10-01 | Disposition: A | Discharge status: Home / Self Care | Payer: Medicare Other | Source: Ambulatory Visit | Attending: Nephrology | Admitting: Nephrology

## 2011-10-01 DIAGNOSIS — I69998 Other sequelae following unspecified cerebrovascular disease: Secondary | ICD-10-CM | POA: Insufficient documentation

## 2011-10-01 DIAGNOSIS — T82898A Other specified complication of vascular prosthetic devices, implants and grafts, initial encounter: Secondary | ICD-10-CM | POA: Insufficient documentation

## 2011-10-01 DIAGNOSIS — N186 End stage renal disease: Secondary | ICD-10-CM | POA: Insufficient documentation

## 2011-10-01 DIAGNOSIS — E785 Hyperlipidemia, unspecified: Secondary | ICD-10-CM | POA: Insufficient documentation

## 2011-10-01 DIAGNOSIS — I871 Compression of vein: Secondary | ICD-10-CM | POA: Insufficient documentation

## 2011-10-01 DIAGNOSIS — R29898 Other symptoms and signs involving the musculoskeletal system: Secondary | ICD-10-CM | POA: Insufficient documentation

## 2011-10-01 DIAGNOSIS — Y832 Surgical operation with anastomosis, bypass or graft as the cause of abnormal reaction of the patient, or of later complication, without mention of misadventure at the time of the procedure: Secondary | ICD-10-CM | POA: Insufficient documentation

## 2011-10-01 DIAGNOSIS — I120 Hypertensive chronic kidney disease with stage 5 chronic kidney disease or end stage renal disease: Secondary | ICD-10-CM | POA: Insufficient documentation

## 2011-10-01 MED ORDER — IOHEXOL 300 MG/ML  SOLN
100.0000 mL | Freq: Once | INTRAMUSCULAR | Status: AC | PRN
  Administered 2011-10-01: 50 mL via INTRAVENOUS

## 2011-10-01 NOTE — H&P (Signed)
[name] is an 71 y.o. male.   Chief Complaint: Decreased flows from left arm shunt. 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  No complaints today.  Shuntogram demonstrated recurrent narrowing of the outflow vein in the left axilla.  Past Medical History  Diagnosis Date  . ESRD (end stage renal disease)     dialysis-t/th/sat  . Hyperthyroidism   . Diverticula of intestine 03/2010  . Hyperlipemia   . Hypertension   . Constipation   . CVA (cerebral vascular accident) 1985    lt side weakness  . Gout   . History of GI bleed 2012    Past Surgical History  Procedure Date  . Circumcision 2009  . Destruction lesion penile [id number]  . Dg av dialysis graft declot or multiple times-last4/11    lt upper forearm  av graft at present    No family history on file. Social History:  reports that he has never smoked. He does not have any smokeless tobacco history on file. He reports that he does not drink alcohol or use illicit drugs.  Allergies: No Known Allergies  Current Outpatient Prescriptions on File Prior to Encounter  Medication Sig Dispense Refill  . calcium acetate (PHOSLO) 667 MG capsule Take 667 mg by mouth 3 (three) times daily with meals.      . cinacalcet (SENSIPAR) 30 MG tablet Take 30 mg by mouth at bedtime.      . dorzolamide (TRUSOPT) 2 % ophthalmic solution 1 drop 2 (two) times daily.      . fish oil-omega-3 fatty acids 1000 MG capsule Take 2 g by mouth daily.      Marland Kitchen latanoprost (XALATAN) 0.005 % ophthalmic solution 1 drop at bedtime.      Marland Kitchen lisinopril (PRINIVIL,ZESTRIL) 20 MG tablet Take 20 mg by mouth at bedtime.      . multivitamin (RENA-VIT) TABS tablet Take 1 tablet by mouth daily.      . polyethylene glycol (MIRALAX / GLYCOLAX) packet Take 17 g by mouth daily as needed.      . simvastatin (ZOCOR) 40 MG tablet Take 40 mg by mouth every evening.      . timolol (BETIMOL) 0.5 % ophthalmic solution 1 drop 2 (two) times daily.         Review of Systems  Constitutional: Negative.   Respiratory: Negative.   Cardiovascular: Negative.   Gastrointestinal: Negative.     There were no vitals taken for this visit. Physical Exam  Cardiovascular: Normal rate.   Murmur heard. Respiratory: Effort normal and breath sounds normal. No respiratory distress. He has no wheezes. He has no rales. He exhibits no tenderness.     Assessment/Plan 71 yo with ESRD and recurrent narrowing of the left upper arm shunt outflow vein.  Discussed angioplasty and stent placement with patient.  Informed consent obtained.  Plan to perform procedure without sedation.   Iris Tatsch RYAN 10/01/2011, 8:06 AM

## 2011-10-01 NOTE — Procedures (Signed)
Shuntogram demonstrated recurrent narrowing of the left upper arm outflow vein.  Successful balloon angioplasty of the narrowing with a 6 mm balloon.  No immediate complication.

## 2012-01-08 ENCOUNTER — Other Ambulatory Visit (HOSPITAL_COMMUNITY): Payer: Self-pay | Admitting: Nephrology

## 2012-01-08 DIAGNOSIS — N186 End stage renal disease: Secondary | ICD-10-CM

## 2012-01-11 ENCOUNTER — Other Ambulatory Visit (HOSPITAL_COMMUNITY): Payer: Self-pay | Admitting: Nephrology

## 2012-01-11 ENCOUNTER — Ambulatory Visit (HOSPITAL_COMMUNITY)
Admission: RE | Admit: 2012-01-11 | Discharge: 2012-01-11 | Disposition: A | Discharge status: Home / Self Care | Payer: Medicare Other | Source: Ambulatory Visit | Attending: Nephrology | Admitting: Nephrology

## 2012-01-11 DIAGNOSIS — N186 End stage renal disease: Secondary | ICD-10-CM | POA: Insufficient documentation

## 2012-01-11 MED ORDER — IOHEXOL 300 MG/ML  SOLN
100.0000 mL | Freq: Once | INTRAMUSCULAR | Status: AC | PRN
  Administered 2012-01-11: 50 mL via INTRAVENOUS

## 2012-01-11 NOTE — H&P (Signed)
[name] is an 71 y.o. male.   Chief Complaint: esrd, decreased access flow rates HPI: here for shuntogram and intervention  Past Medical History  Diagnosis Date  . ESRD (end stage renal disease)     dialysis-t/th/sat  . Hyperthyroidism   . Diverticula of intestine 03/2010  . Hyperlipemia   . Hypertension   . Constipation   . CVA (cerebral vascular accident) 1985    lt side weakness  . Gout   . History of GI bleed 2012    Past Surgical History  Procedure Date  . Circumcision 2009  . Destruction lesion penile [id number]  . Dg av dialysis graft declot or multiple times-last4/11    lt upper forearm  av graft at present    No family history on file. Social History:  reports that he has never smoked. He does not have any smokeless tobacco history on file. He reports that he does not drink alcohol or use illicit drugs.  Allergies: No Known Allergies   (Not in a hospital admission)  No results found for this or any previous visit (from the past 48 hour(s)). No results found.  Review of Systems  Constitutional: Negative for fever and chills.  Respiratory: Negative for cough.   Cardiovascular: Negative for chest pain.  Gastrointestinal: Negative for nausea and vomiting.  Neurological: Negative for headaches.    There were no vitals taken for this visit. Physical Exam  Constitutional: He is oriented to person, place, and time.  Cardiovascular: Normal rate and regular rhythm.  Exam reveals no friction rub.   No murmur heard. Respiratory: Effort normal and breath sounds normal.  GI: Soft. Bowel sounds are normal. He exhibits no distension.  Neurological: He is alert and oriented to person, place, and time.  Psychiatric: He has a normal mood and affect.     Assessment/Plan LUE avg outflow stenosis, plan for PTA POSSIBLE STENT IF NEEDED.  CONSENT OBTAINED  Payeton Germani T. 01/11/2012, 8:23 AM

## 2012-01-11 NOTE — Procedures (Signed)
Successful LUE AVG PTA OF A RECURRENT STENOSIS NO COMP Stable Full report in PACS

## 2012-01-14 ENCOUNTER — Telehealth (HOSPITAL_COMMUNITY): Payer: Self-pay

## 2012-01-14 NOTE — Telephone Encounter (Signed)
Radiology post procedure call:Pt denies questions states he is doing fine

## 2012-06-04 ENCOUNTER — Other Ambulatory Visit: Payer: Self-pay | Admitting: Urology

## 2012-06-19 NOTE — Progress Notes (Signed)
UPON REVIEWING PT CHART NOTED LAST SURGERY AT [facility] WAS  ASA IV BY DR CARIGNIN.  REVIEWED CHART TODAY W/ DR GERMEROTH MDA , WHOM SPOKE W/ DR FORTUNE MDA WHO WILL BE MDA DOS. THEY BOTH AGREE PT IS OK TO BE DONE HERE UNLESS NEW SYMPTOMS AND/OR MEDICAL DEVELOPMENT HAS RISEN.

## 2012-06-19 NOTE — Anesthesia Preprocedure Evaluation (Addendum)
Anesthesia Evaluation  Patient identified by MRN, date of birth, ID band Patient awake    Reviewed: Allergy & Precautions, H&P , NPO status , Patient's Chart, lab work & pertinent test results  Airway Mallampati: II TM Distance: >3 FB Neck ROM: Full    Dental no notable dental hx. (+) Dental Advisory Given, Upper Dentures and Partial Lower   Pulmonary neg pulmonary ROS,  breath sounds clear to auscultation  Pulmonary exam normal       Cardiovascular hypertension, Pt. on medications negative cardio ROS  + dysrhythmias + Valvular Problems/Murmurs Rhythm:Regular Rate:Normal + Systolic murmurs (Grade 3/6)    Neuro/Psych CVA (l sided weakness), Residual Symptoms negative psych ROS   GI/Hepatic Neg liver ROS, hiatal hernia,   Endo/Other  negative endocrine ROSdiabetes (Borderline DM; diet control)Hyperthyroidism   Renal/GU ESRF and DialysisRenal disease (hemodialysis t/thur/sat)  negative genitourinary   Musculoskeletal negative musculoskeletal ROS (+)   Abdominal   Peds negative pediatric ROS (+)  Hematology negative hematology ROS (+)   Anesthesia Other Findings   Reproductive/Obstetrics negative OB ROS                         Anesthesia Physical Anesthesia Plan  ASA: III  Anesthesia Plan: MAC   Post-op Pain Management:    Induction: Intravenous  Airway Management Planned:   Additional Equipment:   Intra-op Plan:   Post-operative Plan:   Informed Consent: I have reviewed the patients History and Physical, chart, labs and discussed the procedure including the risks, benefits and alternatives for the proposed anesthesia with the patient or authorized representative who has indicated his/her understanding and acceptance.   Dental advisory given  Plan Discussed with: CRNA  Anesthesia Plan Comments:        Anesthesia Quick Evaluation                                   Anesthesia  Evaluation  Patient identified by MRN, date of birth, ID band Patient awake    Reviewed: Allergy & Precautions, H&P , NPO status , Patient's Chart, lab work & pertinent test results  Airway Mallampati: II TM Distance: >3 FB Neck ROM: Full    Dental No notable dental hx.    Pulmonary neg pulmonary ROS,  breath sounds clear to auscultation  Pulmonary exam normal       Cardiovascular hypertension, Pt. on medications negative cardio ROS  Rhythm:Regular Rate:Normal     Neuro/Psych CVA (l sided weakness), Residual Symptoms negative neurological ROS  negative psych ROS   GI/Hepatic negative GI ROS, Neg liver ROS,   Endo/Other  negative endocrine ROS  Renal/GU ESRF and DialysisRenal disease (hemodialysis t/thur/sat)negative Renal ROS  negative genitourinary   Musculoskeletal negative musculoskeletal ROS (+)   Abdominal   Peds negative pediatric ROS (+)  Hematology negative hematology ROS (+)   Anesthesia Other Findings   Reproductive/Obstetrics negative OB ROS                           Anesthesia Physical Anesthesia Plan  ASA: IV  Anesthesia Plan: MAC   Post-op Pain Management:    Induction: Intravenous  Airway Management Planned: Simple Face Mask  Additional Equipment:   Intra-op Plan:   Post-operative Plan: Extubation in OR  Informed Consent: I have reviewed the patients History and Physical, chart, labs and discussed the procedure including the risks,  benefits and alternatives for the proposed anesthesia with the patient or authorized representative who has indicated his/her understanding and acceptance.   Dental advisory given  Plan Discussed with: CRNA  Anesthesia Plan Comments:        Anesthesia Quick Evaluation

## 2012-06-20 ENCOUNTER — Encounter (HOSPITAL_BASED_OUTPATIENT_CLINIC_OR_DEPARTMENT_OTHER): Payer: Self-pay | Admitting: *Deleted

## 2012-06-20 NOTE — Progress Notes (Signed)
NPO AFTER MN. ARRIVES AT 0830. NEEDS ISTAT 8 AND EKG.

## 2012-06-23 ENCOUNTER — Encounter (HOSPITAL_BASED_OUTPATIENT_CLINIC_OR_DEPARTMENT_OTHER): Payer: Self-pay | Admitting: Anesthesiology

## 2012-06-23 ENCOUNTER — Encounter (HOSPITAL_BASED_OUTPATIENT_CLINIC_OR_DEPARTMENT_OTHER): Payer: Self-pay | Admitting: *Deleted

## 2012-06-23 ENCOUNTER — Ambulatory Visit (HOSPITAL_BASED_OUTPATIENT_CLINIC_OR_DEPARTMENT_OTHER)
Admission: RE | Admit: 2012-06-23 | Discharge: 2012-06-23 | Disposition: A | Discharge status: Home / Self Care | Payer: Medicare Other | Source: Ambulatory Visit | Attending: Urology | Admitting: Urology

## 2012-06-23 ENCOUNTER — Encounter (HOSPITAL_BASED_OUTPATIENT_CLINIC_OR_DEPARTMENT_OTHER)
Admission: RE | Disposition: A | Discharge status: Home / Self Care | Payer: Self-pay | Source: Ambulatory Visit | Attending: Urology

## 2012-06-23 ENCOUNTER — Other Ambulatory Visit: Payer: Self-pay

## 2012-06-23 ENCOUNTER — Ambulatory Visit (HOSPITAL_BASED_OUTPATIENT_CLINIC_OR_DEPARTMENT_OTHER): Payer: Medicare Other | Admitting: Anesthesiology

## 2012-06-23 DIAGNOSIS — N2581 Secondary hyperparathyroidism of renal origin: Secondary | ICD-10-CM | POA: Insufficient documentation

## 2012-06-23 DIAGNOSIS — Z8673 Personal history of transient ischemic attack (TIA), and cerebral infarction without residual deficits: Secondary | ICD-10-CM | POA: Insufficient documentation

## 2012-06-23 DIAGNOSIS — D074 Carcinoma in situ of penis: Secondary | ICD-10-CM | POA: Insufficient documentation

## 2012-06-23 DIAGNOSIS — C609 Malignant neoplasm of penis, unspecified: Secondary | ICD-10-CM

## 2012-06-23 DIAGNOSIS — N186 End stage renal disease: Secondary | ICD-10-CM | POA: Insufficient documentation

## 2012-06-23 DIAGNOSIS — E785 Hyperlipidemia, unspecified: Secondary | ICD-10-CM | POA: Insufficient documentation

## 2012-06-23 DIAGNOSIS — I120 Hypertensive chronic kidney disease with stage 5 chronic kidney disease or end stage renal disease: Secondary | ICD-10-CM | POA: Insufficient documentation

## 2012-06-23 DIAGNOSIS — Z79899 Other long term (current) drug therapy: Secondary | ICD-10-CM | POA: Insufficient documentation

## 2012-06-23 HISTORY — DX: Hemiplegia, unspecified affecting left nondominant side: G81.94

## 2012-06-23 HISTORY — DX: End stage renal disease: N18.6

## 2012-06-23 HISTORY — PX: PENILE BIOPSY: SHX6013

## 2012-06-23 HISTORY — DX: Personal history of other diseases of the digestive system: Z87.19

## 2012-06-23 HISTORY — DX: Malignant neoplasm of penis, unspecified: C60.9

## 2012-06-23 HISTORY — PX: YAG LASER APPLICATION: SHX6189

## 2012-06-23 HISTORY — DX: Personal history of transient ischemic attack (TIA), and cerebral infarction without residual deficits: Z86.73

## 2012-06-23 HISTORY — DX: Dependence on renal dialysis: Z99.2

## 2012-06-23 HISTORY — DX: Unspecified glaucoma: H40.9

## 2012-06-23 HISTORY — DX: Unspecified right bundle-branch block: I45.10

## 2012-06-23 HISTORY — DX: Secondary hyperparathyroidism of renal origin: N25.81

## 2012-06-23 LAB — POCT I-STAT, CHEM 8
BUN: 44 mg/dL — ABNORMAL HIGH (ref 6–23)
Calcium, Ion: 1.16 mmol/L (ref 1.13–1.30)
Chloride: 97 mEq/L (ref 96–112)
Creatinine, Ser: 7.6 mg/dL — ABNORMAL HIGH (ref 0.50–1.35)
Glucose, Bld: 83 mg/dL (ref 70–99)
Potassium: 4.2 mEq/L (ref 3.5–5.1)

## 2012-06-23 SURGERY — BIOPSY, PENIS
Anesthesia: Monitor Anesthesia Care | Site: Penis | Wound class: Clean

## 2012-06-23 MED ORDER — ONDANSETRON HCL 4 MG/2ML IJ SOLN
4.0000 mg | Freq: Four times a day (QID) | INTRAMUSCULAR | Status: DC | PRN
  Filled 2012-06-23: qty 2

## 2012-06-23 MED ORDER — CEFAZOLIN SODIUM-DEXTROSE 2-3 GM-% IV SOLR
2.0000 g | INTRAVENOUS | Status: AC
  Administered 2012-06-23: 2 g via INTRAVENOUS
  Filled 2012-06-23: qty 50

## 2012-06-23 MED ORDER — PROPOFOL 10 MG/ML IV BOLUS
INTRAVENOUS | Status: DC | PRN
  Administered 2012-06-23: 50 mg via INTRAVENOUS

## 2012-06-23 MED ORDER — SODIUM CHLORIDE 0.9 % IJ SOLN
3.0000 mL | Freq: Two times a day (BID) | INTRAMUSCULAR | Status: DC
  Filled 2012-06-23: qty 3

## 2012-06-23 MED ORDER — LACTATED RINGERS IV SOLN
INTRAVENOUS | Status: DC
  Filled 2012-06-23: qty 1000

## 2012-06-23 MED ORDER — LIDOCAINE HCL 1 % IJ SOLN
INTRAMUSCULAR | Status: DC | PRN
  Administered 2012-06-23: 10 mL

## 2012-06-23 MED ORDER — LIDOCAINE HCL (CARDIAC) 20 MG/ML IV SOLN
INTRAVENOUS | Status: DC | PRN
  Administered 2012-06-23: 50 mg via INTRAVENOUS

## 2012-06-23 MED ORDER — SODIUM CHLORIDE 0.9 % IJ SOLN
3.0000 mL | INTRAMUSCULAR | Status: DC | PRN
  Filled 2012-06-23: qty 3

## 2012-06-23 MED ORDER — PROPOFOL 10 MG/ML IV EMUL
INTRAVENOUS | Status: DC | PRN
  Administered 2012-06-23: 25 ug/kg/min via INTRAVENOUS

## 2012-06-23 MED ORDER — CEPHALEXIN 250 MG PO CAPS: 250.0000 mg | ORAL_CAPSULE | Freq: Two times a day (BID) | ORAL | Status: DC

## 2012-06-23 MED ORDER — FENTANYL CITRATE 0.05 MG/ML IJ SOLN
INTRAMUSCULAR | Status: DC | PRN
  Administered 2012-06-23: 50 ug via INTRAVENOUS

## 2012-06-23 MED ORDER — CEFAZOLIN SODIUM 1-5 GM-% IV SOLN
1.0000 g | INTRAVENOUS | Status: DC
  Filled 2012-06-23: qty 50

## 2012-06-23 MED ORDER — MORPHINE SULFATE 2 MG/ML IJ SOLN
1.0000 mg | INTRAMUSCULAR | Status: DC | PRN
  Filled 2012-06-23: qty 1

## 2012-06-23 MED ORDER — SODIUM CHLORIDE 0.9 % IV SOLN
250.0000 mL | INTRAVENOUS | Status: DC | PRN
  Filled 2012-06-23: qty 250

## 2012-06-23 MED ORDER — HYDROCODONE-ACETAMINOPHEN 5-325 MG PO TABS: 1.0000 | ORAL_TABLET | ORAL | Status: DC | PRN

## 2012-06-23 MED ORDER — SODIUM CHLORIDE 0.9 % IV SOLN
INTRAVENOUS | Status: DC
  Administered 2012-06-23: 10:00:00 via INTRAVENOUS
  Filled 2012-06-23: qty 1000

## 2012-06-23 MED ORDER — ACETAMINOPHEN 325 MG PO TABS
650.0000 mg | ORAL_TABLET | ORAL | Status: DC | PRN
  Filled 2012-06-23: qty 2

## 2012-06-23 MED ORDER — OXYCODONE HCL 5 MG PO TABS
5.0000 mg | ORAL_TABLET | ORAL | Status: DC | PRN
Start: 2012-06-23 — End: 2012-06-23
  Administered 2012-06-23: 5 mg via ORAL
  Filled 2012-06-23: qty 2

## 2012-06-23 MED ORDER — ACETAMINOPHEN 650 MG RE SUPP
650.0000 mg | RECTAL | Status: DC | PRN
  Filled 2012-06-23: qty 1

## 2012-06-23 MED ORDER — BUPIVACAINE HCL 0.25 % IJ SOLN
INTRAMUSCULAR | Status: DC | PRN
  Administered 2012-06-23: 10 mL

## 2012-06-23 SURGICAL SUPPLY — 36 items
BANDAGE CO FLEX L/F 1IN X 5YD (GAUZE/BANDAGES/DRESSINGS) ×2 IMPLANT
BANDAGE CONFORM 2  STR LF (GAUZE/BANDAGES/DRESSINGS) ×2 IMPLANT
BLADE SURG 15 STRL LF DISP TIS (BLADE) ×1 IMPLANT
BLADE SURG 15 STRL SS (BLADE) ×2
CLOTH BEACON ORANGE TIMEOUT ST (SAFETY) ×2 IMPLANT
COVER MAYO STAND STRL (DRAPES) ×2 IMPLANT
COVER TABLE BACK 60X90 (DRAPES) ×2 IMPLANT
DRAPE PED LAPAROTOMY (DRAPES) ×2 IMPLANT
ELECT NDL TIP 2.8 STRL (NEEDLE) IMPLANT
ELECT NEEDLE TIP 2.8 STRL (NEEDLE) IMPLANT
ELECT REM PT RETURN 9FT ADLT (ELECTROSURGICAL) ×2
ELECTRODE REM PT RTRN 9FT ADLT (ELECTROSURGICAL) ×1 IMPLANT
GAUZE VASELINE 1X8 (GAUZE/BANDAGES/DRESSINGS) ×1 IMPLANT
GLOVE BIO SURGEON STRL SZ 6.5 (GLOVE) ×2 IMPLANT
GLOVE BIO SURGEON STRL SZ7.5 (GLOVE) ×1 IMPLANT
GLOVE BIO SURGEON STRL SZ8 (GLOVE) ×2 IMPLANT
GLOVE BIOGEL PI IND STRL 6.5 (GLOVE) IMPLANT
GLOVE BIOGEL PI INDICATOR 6.5 (GLOVE) ×1
GLOVE ECLIPSE 6.0 STRL STRAW (GLOVE) ×1 IMPLANT
GOWN PREVENTION PLUS LG XLONG (DISPOSABLE) ×3 IMPLANT
GOWN STRL REIN XL XLG (GOWN DISPOSABLE) ×2 IMPLANT
HANDPIECE CURVED VASC (MISCELLANEOUS) ×1 IMPLANT
NDL HYPO 25X5/8 SAFETYGLIDE (NEEDLE) ×1 IMPLANT
NEEDLE HYPO 22GX1.5 SAFETY (NEEDLE) ×2 IMPLANT
NEEDLE HYPO 25X5/8 SAFETYGLIDE (NEEDLE) ×2 IMPLANT
NS IRRIG 500ML POUR BTL (IV SOLUTION) ×2 IMPLANT
PACK BASIN DAY SURGERY FS (CUSTOM PROCEDURE TRAY) ×2 IMPLANT
PENCIL BUTTON HOLSTER BLD 10FT (ELECTRODE) ×2 IMPLANT
SLT LASER WAND IMPLANT
SUT CHROMIC 4 0 RB 1X27 (SUTURE) ×2 IMPLANT
SUT CHROMIC 5 0 RB 1 27 (SUTURE) IMPLANT
SYR CONTROL 10ML LL (SYRINGE) ×2 IMPLANT
TOWEL NATURAL 4PK STERILE (DISPOSABLE) ×2 IMPLANT
TOWEL OR 17X24 6PK STRL BLUE (TOWEL DISPOSABLE) ×4 IMPLANT
VACUUM HOSE 7/8X10 W/ WAND (MISCELLANEOUS) ×1 IMPLANT
WATER STERILE IRR 500ML POUR (IV SOLUTION) ×2 IMPLANT

## 2012-06-23 NOTE — H&P (Signed)
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 His history as below:  he originally presented approximately 5 years ago with a large lesion on his uncircumcised penis. He underwent circumcision for this presumed squamous cell carcinoma. Pathology confirmed suspicions of cancer. There was no deep invasion, and he has been treated on 3 other occasions for local recurrences with either CO2,, YAG laser or excision. He presents now for another recurrence within one year of his last treatment. There is no evidence of inguinal adenopathy, but he has fairly confluent disease on his coronal area circumferentially. Because of this, I have recommended we proceed with more aggressive laser ablation, with the YAG, which will provide deeper tissue penetration. The patient agrees with the procedure and desires to proceed.   PMH: Past Medical History  Diagnosis Date  . Diverticula of intestine 03/2010  . Hyperlipemia   . Hypertension   . Gout PER PT STABLE  . History of GI bleed 2012  . History of CVA (cerebrovascular accident) 1985 SECONDARY TO HTN---  LEFT HEMIPARISIS  . Secondary hyperparathyroidism of renal origin   . H/O hiatal hernia   . Glaucoma   . End stage renal failure on dialysis SECONDARY TO NEPHROSCLEROSIS    NEPHROLOGIST--- DR DETERDING--  HEMODIALYSIS ON  T/TH/ SAT--  LEFT UPPER ARM AV GRAFT  . RBBB   . 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 Penile cancer     PSH: Past Surgical History  Procedure Laterality Date  . Circumcision  05-23-2007    AND EXCISION PENILE MASS  . Destruction lesion penile  [id number]  . Dg av dialysis graft declot or  multiple times- last 4/11    lt upper forearm  av graft at present  . Co2 laser ablation of penile lesion  11-26-2007    RECURRENT SQUAMOUS CELL CARCINOMA  . Excisional bx penile  lesion  07-22-2009  . Co2 laser of penile in situ carcinoma and bx  07-26-2010  . 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 calcium acetate (PHOSLO) 667 MG capsule Take 667 mg by mouth 3 (three) times daily with meals.      . cinacalcet (SENSIPAR) 30 MG tablet Take 30 mg by mouth at bedtime.      . fish oil-omega-3 fatty acids 1000 MG capsule Take 2 g by mouth daily.      Marland Kitchen lisinopril (PRINIVIL,ZESTRIL) 20 MG tablet Take 20 mg by mouth at bedtime.      . multivitamin (RENA-VIT) TABS tablet Take 1 tablet by mouth daily.      . polyethylene glycol (MIRALAX / GLYCOLAX) packet Take 17 g by mouth daily as needed.      . simvastatin (ZOCOR) 40 MG tablet Take 40 mg by mouth every evening.      . dorzolamide (TRUSOPT) 2 % ophthalmic solution 1 drop 2 (two) times daily.      Marland Kitchen latanoprost (XALATAN) 0.005 % ophthalmic solution 1 drop at bedtime.      . timolol (BETIMOL) 0.5 % ophthalmic solution 1 drop 2 (two) times daily.         Social History: History   Social History  . Marital Status: Single    Spouse Name:  N/A    Number of Children: N/A  . Years of Education: N/A   Occupational History  . Not on file.   Social History Main Topics  . Smoking status: Never Smoker   . Smokeless tobacco: Not on file  . Alcohol Use: No     Comment: quit 2002  . Drug Use: No  . Sexually Active: Not on file   Other Topics Concern  . Not on file   Social History Narrative  . No narrative on file    Family History: History reviewed. No pertinent family history.  Review of Systems: Positive: none Negative:   A further 10 point review of systems was negative except what is listed in the HPI.  Physical Exam: @VITALS2 @ General: No acute distress.  Awake. Head:  Normocephalic.  Atraumatic. ENT:  EOMI.  Mucous membranes moist Neck:  Supple.  No  lymphadenopathy. CV:  S1 present. S2 present. Regular rate. Pulmonary: Equal effort bilaterally.  Clear to auscultation bilaterally. Abdomen: Soft, scaphoid, non- tender to palpation.no inguinal adenopathy is noted Skin:  Normal turgor.  No visible rash. Extremity: No gross deformity of bilateral upper extremities.  No gross deformity of    bilateral lower extremities. Neurologic: Alert. Appropriate mood.  Penis:  Circumcised, with velvety recurrence of carcinoma on his coronalridge Urethra:   Orthotopic meatus. Scrotum: No lesions.  No ecchymosis.  No erythema. Testicles: Atrophic, descended bilaterally.  No masses bilaterally.   Studies:  No results found for this basename: HGB, WBC, PLT,  in the last 72 hours  No results found for this basename: NA, K, CL, CO2, BUN, CREATININE, CALCIUM, MAGNESIUM, GFRNONAA, GFRAA,  in the last 72 hours   No results found for this basename: PT, INR, APTT,  in the last 72 hours   No components found with this basename: ABG,     Assessment:  Recurrent carcinoma in situ of penis  Plan: Repeat YAG laser application

## 2012-06-23 NOTE — Op Note (Signed)
Preoperative diagnosis: Recurrent carcinoma in situ of penis  Postoperative diagnosis: Same   Procedure: YAG laser application to penile carcinoma    Surgeon: Bertram Millard. Colman Birdwell, M.D.   Anesthesia: Local with MAC  Complications: None  Specimen(s): None  Drain(s): None  Indications: 72 year-old male with persistent/recurrent carcinoma in situ of penis. His last procedure was approximately a year ago. This would make it approximately his fifth  procedure for management of his recurrent carcinoma in situ which is localized to his glands. He is aware  Of the procedure of laser ablation of his penile carcinoma. He understands the procedure as well as risks and complications and desires to proceed.  Technique and findings: The patient was properly identified in the holding area and received preoperative IV antibiotics. He was taken to the operating room where he was placed in the supine position on the table. IV sedation was given. His genitalia were prepped and draped. 20 cc of a 50-50 solution of 1% plain lidocaine and quarter percent Marcaine were given and a dorsal penile block. Careful inspection was made of the distal penis and the glans. On the glans, there was an approximate 1.5 cm square area with a velvety epithelium, mainly around the urethral meatus. It did not appear to invade into the urethral meatus, however. There was an approximate 3-1/2 cm square area on the coronal ridge there was also indicative of the recurrent carcinoma in situ. Following establishment of the block, I used the YAG laser with the contact tip to ablate both superficially and deep using 30 W of power. As I used the laser energy, the external surfaces were abraded with a wet sponge, and deeper penetration obtained after application of several different layers, both on the glans and coronal ridge. I was quite liberal in treating both the abnormal appearing areas as well as surrounding areas, with approximately 6 cm  square area treated with the Yag laser. There was mild bleeding which was easily controlled. Following complete treatment of all affected areas, I placed a Vaseline gauze, and then applied a Kling dressing over top of that. The patient tolerated procedure well. He was awakened and taken to the PACU in stable condition.  He will followup with me in approximately 4 weeks.

## 2012-06-23 NOTE — Anesthesia Postprocedure Evaluation (Signed)
Anesthesia Post Note  Patient: [name]  Procedure(s) Performed: Procedure(s) (LRB):  C02 LASER OF PENIS   (N/A) YAG LASER APPLICATION (N/A)  Anesthesia type: MAC  Patient location: PACU  Post pain: Pain level controlled  Post assessment: Post-op Vital signs reviewed  Last Vitals:  Filed Vitals:   06/23/12 1100  BP: 155/68  Pulse: 60  Temp:   Resp: 9    Post vital signs: Reviewed  Level of consciousness: sedated  Complications: No apparent anesthesia complications

## 2012-06-23 NOTE — Transfer of Care (Signed)
Immediate Anesthesia Transfer of Care Note  Patient: [name]  Procedure(s) Performed: Procedure(s): PENILE BIOPSY (N/A) YAG LASER APPLICATION (N/A)  Patient Location: PACU  Anesthesia Type:MAC  Level of Consciousness: awake, alert  and oriented  Airway & Oxygen Therapy: Patient Spontanous Breathing  Post-op Assessment: Report given to PACU RN  Post vital signs: Reviewed and stable  Complications: No apparent anesthesia complications

## 2012-06-24 ENCOUNTER — Encounter (HOSPITAL_BASED_OUTPATIENT_CLINIC_OR_DEPARTMENT_OTHER): Payer: Self-pay | Admitting: Urology

## 2012-07-07 ENCOUNTER — Other Ambulatory Visit (HOSPITAL_COMMUNITY): Payer: Self-pay | Admitting: Nephrology

## 2012-07-07 DIAGNOSIS — N186 End stage renal disease: Secondary | ICD-10-CM

## 2012-07-11 ENCOUNTER — Other Ambulatory Visit (HOSPITAL_COMMUNITY): Payer: Self-pay | Admitting: Nephrology

## 2012-07-11 ENCOUNTER — Ambulatory Visit (HOSPITAL_COMMUNITY)
Admission: RE | Admit: 2012-07-11 | Discharge: 2012-07-11 | Disposition: A | Discharge status: Home / Self Care | Payer: Medicare Other | Source: Ambulatory Visit | Attending: Nephrology | Admitting: Nephrology

## 2012-07-11 DIAGNOSIS — I120 Hypertensive chronic kidney disease with stage 5 chronic kidney disease or end stage renal disease: Secondary | ICD-10-CM | POA: Insufficient documentation

## 2012-07-11 DIAGNOSIS — I871 Compression of vein: Secondary | ICD-10-CM | POA: Insufficient documentation

## 2012-07-11 DIAGNOSIS — Z85828 Personal history of other malignant neoplasm of skin: Secondary | ICD-10-CM | POA: Insufficient documentation

## 2012-07-11 DIAGNOSIS — T82898A Other specified complication of vascular prosthetic devices, implants and grafts, initial encounter: Secondary | ICD-10-CM | POA: Insufficient documentation

## 2012-07-11 DIAGNOSIS — E785 Hyperlipidemia, unspecified: Secondary | ICD-10-CM | POA: Insufficient documentation

## 2012-07-11 DIAGNOSIS — Z992 Dependence on renal dialysis: Secondary | ICD-10-CM | POA: Insufficient documentation

## 2012-07-11 DIAGNOSIS — N186 End stage renal disease: Secondary | ICD-10-CM | POA: Insufficient documentation

## 2012-07-11 DIAGNOSIS — I69959 Hemiplegia and hemiparesis following unspecified cerebrovascular disease affecting unspecified side: Secondary | ICD-10-CM | POA: Insufficient documentation

## 2012-07-11 DIAGNOSIS — Y832 Surgical operation with anastomosis, bypass or graft as the cause of abnormal reaction of the patient, or of later complication, without mention of misadventure at the time of the procedure: Secondary | ICD-10-CM | POA: Insufficient documentation

## 2012-07-11 DIAGNOSIS — I4510 Unspecified right bundle-branch block: Secondary | ICD-10-CM | POA: Insufficient documentation

## 2012-07-11 DIAGNOSIS — M109 Gout, unspecified: Secondary | ICD-10-CM | POA: Insufficient documentation

## 2012-07-11 DIAGNOSIS — N2581 Secondary hyperparathyroidism of renal origin: Secondary | ICD-10-CM | POA: Insufficient documentation

## 2012-07-11 DIAGNOSIS — K5730 Diverticulosis of large intestine without perforation or abscess without bleeding: Secondary | ICD-10-CM | POA: Insufficient documentation

## 2012-07-11 MED ORDER — IOHEXOL 300 MG/ML  SOLN
100.0000 mL | Freq: Once | INTRAMUSCULAR | Status: AC | PRN
  Administered 2012-07-11: 50 mL via INTRAVENOUS

## 2012-07-11 NOTE — H&P (Signed)
[name] is an 72 y.o. male.   Chief Complaint: low flows during HD HPI: Previous outflow  Vein PTA. Recurrent stenosis.   Past Medical History  Diagnosis Date  . Diverticula of intestine 03/2010  . Hyperlipemia   . Hypertension   . Gout PER PT STABLE  . History of GI bleed 2012  . History of CVA (cerebrovascular accident) 1985 SECONDARY TO HTN---  LEFT HEMIPARISIS  . Secondary hyperparathyroidism of renal origin   . H/O hiatal hernia   . Glaucoma   . End stage renal failure on dialysis SECONDARY TO NEPHROSCLEROSIS    NEPHROLOGIST--- DR DETERDING--  HEMODIALYSIS ON  T/TH/ SAT--  LEFT UPPER ARM AV GRAFT  . RBBB   . 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 Penile cancer     Past Surgical History  Procedure Laterality Date  . Circumcision  05-23-2007    AND EXCISION PENILE MASS  . Destruction lesion penile  [id number]  . Dg av dialysis graft declot or  multiple times- last 4/11    lt upper forearm  av graft at present  . Co2 laser ablation of penile lesion  11-26-2007    RECURRENT SQUAMOUS CELL CARCINOMA  . Excisional bx penile lesion  07-22-2009  . Co2 laser of penile in situ carcinoma and bx  07-26-2010  . Laser ablation  of recurrent squamous cell penile carcinoma  04-25-2011  . Penile biopsy N/A 06/23/2012    Procedure:  C02 LASER OF PENIS  ;  Surgeon: Marcine Matar, MD;  [facility];  Service: Urology;  Laterality: N/A;  . Yag laser application N/A 06/23/2012    Procedure: YAG LASER APPLICATION;  Surgeon: Marcine Matar, MD;  Location: [facility];  Service: Urology;  Laterality: N/A;    No family history on file. Social History:  reports that he has never smoked. He does not have any smokeless tobacco history on file. He reports that he does not drink alcohol or use illicit drugs.  Allergies: No Known Allergies   (Not in a hospital admission)  No results found for this or any previous visit (from the past 48  hour(s)). No results found.  ROS No CAD. Prior CVA.   There were no vitals taken for this visit. Physical Exam  Constitutional: He is oriented to person, place, and time. He appears well-developed. No distress.  HENT:  Head: Normocephalic.  Cardiovascular: Normal rate, regular rhythm and normal heart sounds.   Respiratory: Effort normal and breath sounds normal.  GI: Soft. He exhibits no distension.  Neurological: He is alert and oriented to person, place, and time.  Skin: Skin is warm and dry.  Psychiatric: He has a normal mood and affect. Thought content normal.     Assessment/Plan Recurrent venous stenosis, plan PTA>   Shraddha Lebron III,DAYNE Anndrea Mihelich 07/11/2012, 8:24 AM

## 2012-07-11 NOTE — Procedures (Signed)
Shuntogram, 7mm venous PTA No complication No blood loss. See complete dictation in [facility].

## 2013-03-10 IMAGING — XA IR SHUNTOGRAM/ FISTULAGRAM
1 series · 12 of 24 positions shown · non-contrast
Comparison: none

CLINICAL DATA: End-stage renal disease, decreased access flow
rates, left upper arm AV graft

[Series 1: run · 12 of 29 slices shown]
[im 2/29]
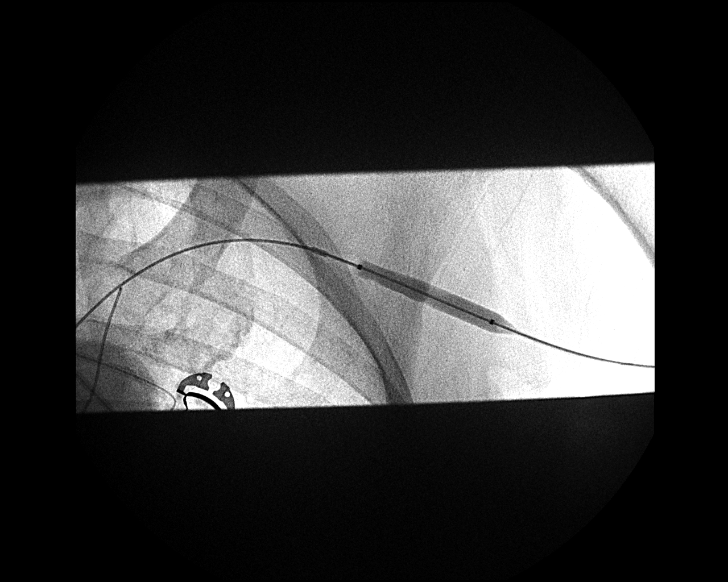
[im 4/29]
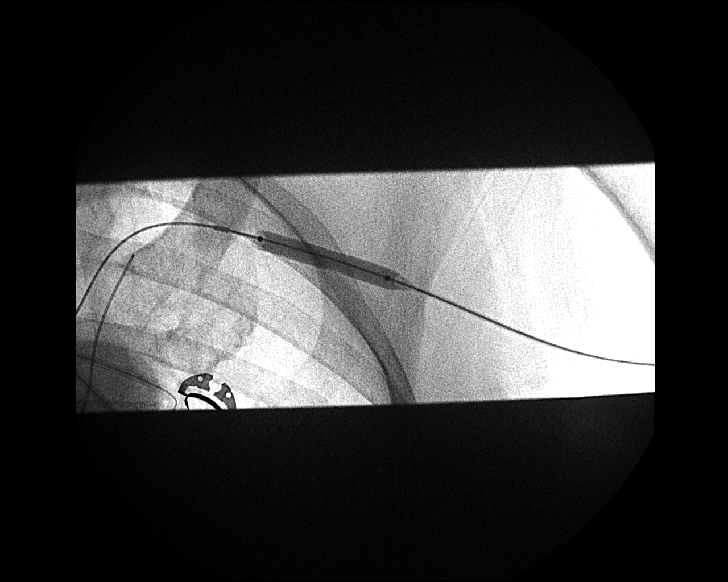
[im 7/29]
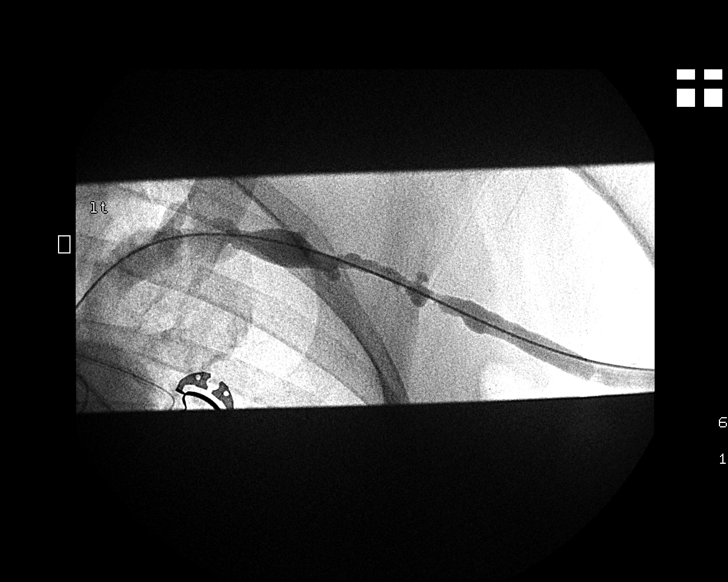
[im 9/29]
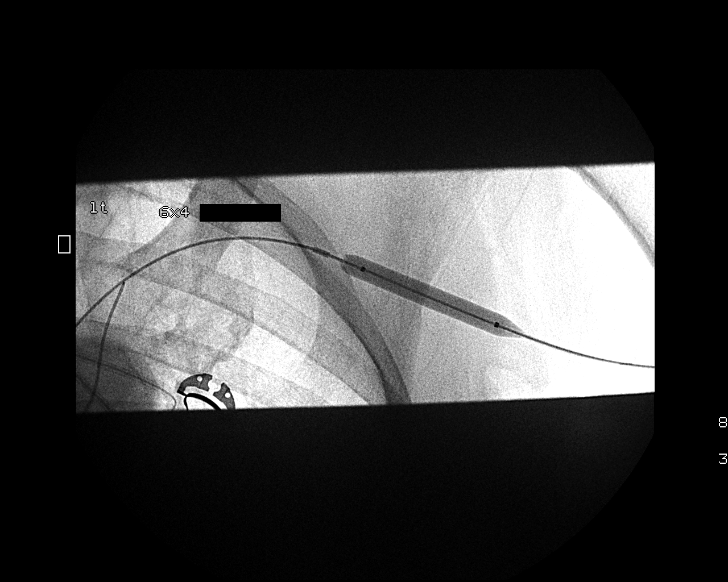
[im 11/29]
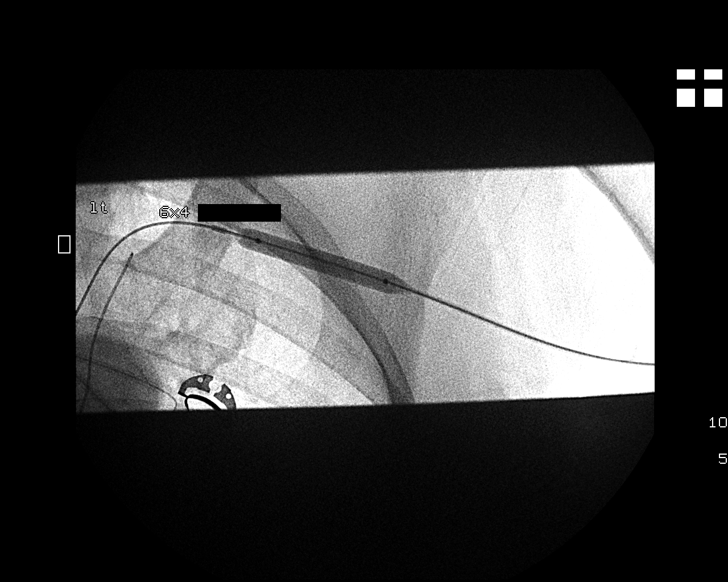
[im 14/29]
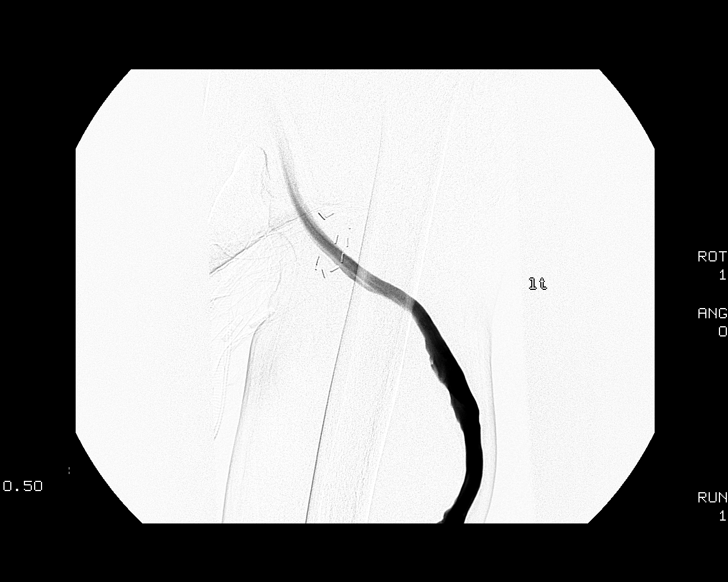
[im 16/29]
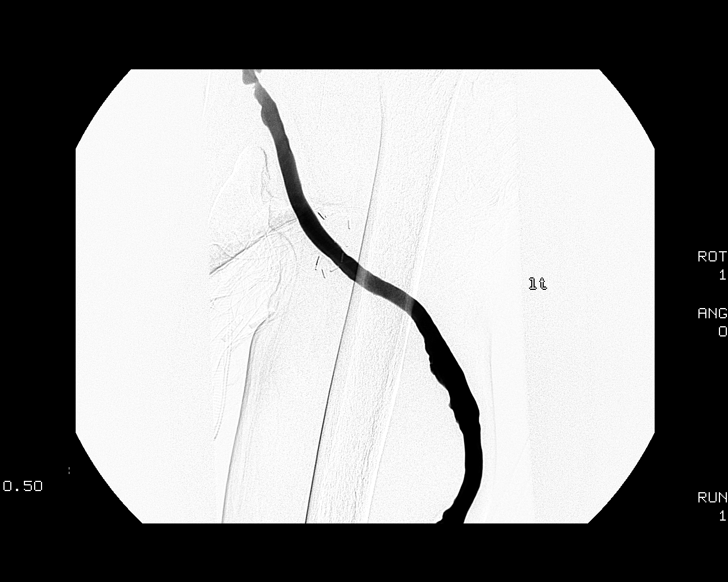
[im 19/29]
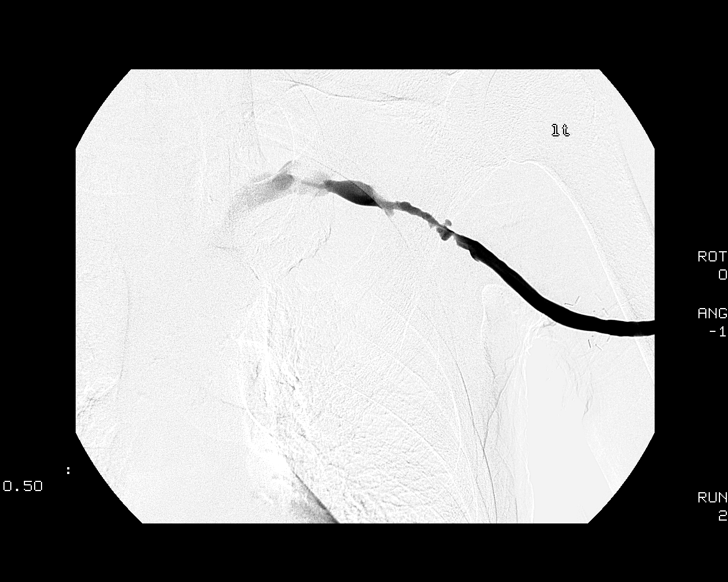
[im 21/29]
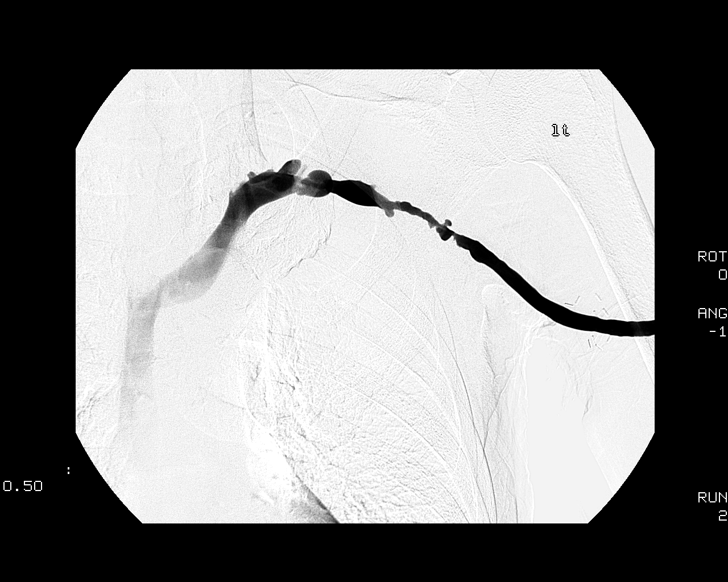
[im 24/29]
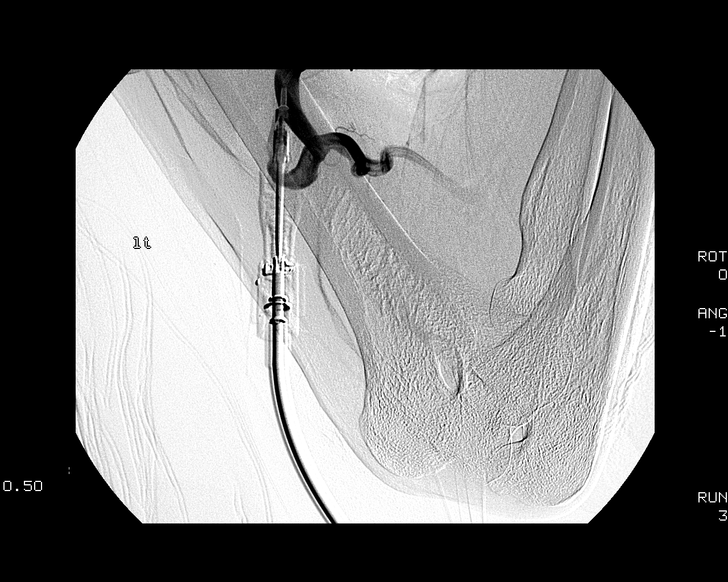
[im 26/29]
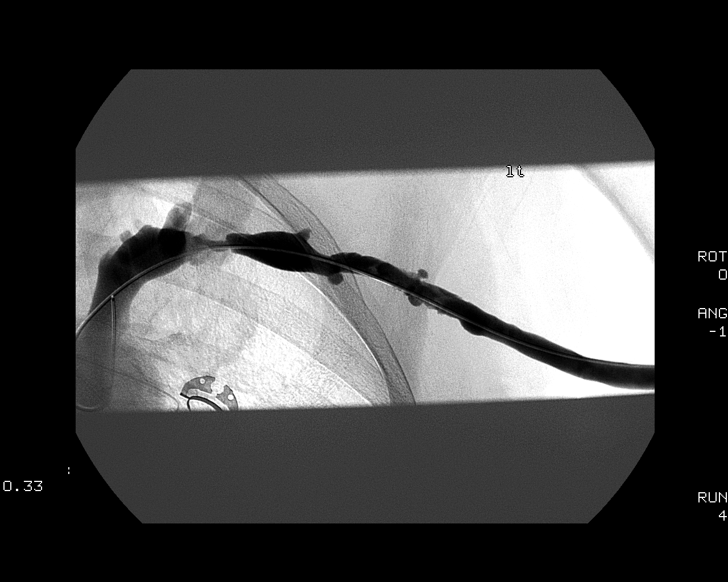
[im 29/29]
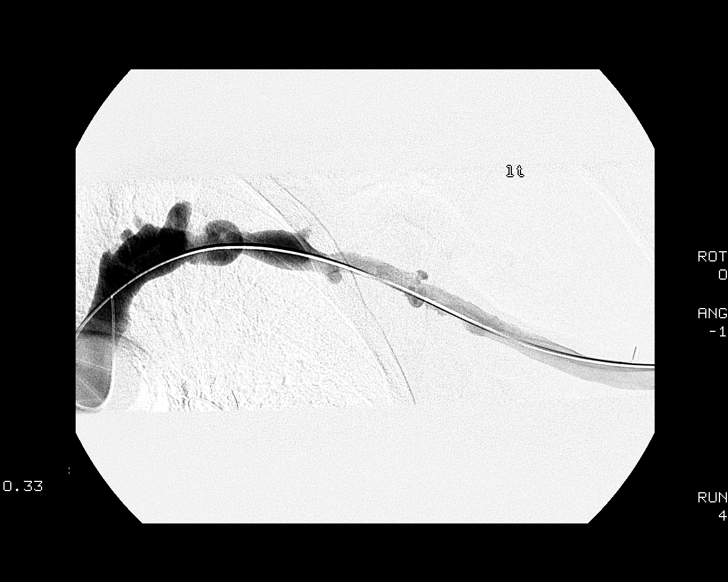

[12 of 24 positions shown; findings below may reference images not displayed]

LEFT UPPER ARM AV GRAFT SHUNTOGRAM
6 MM VENOUS ANGIOPLASTY

Date:  01/11/2012 [DATE]

Radiologist:  Hsv Tiryaki, M.D.

Medications:  1% lidocaine locally

Guidance:  Fluoroscopic

Fluoroscopy time:  1.2 minutes

Sedation time:  None.

Contrast volume:  50 ml [id number]

Complications:  No immediate

PROCEDURE/FINDINGS:

Informed consent was obtained from the patient following
explanation of the procedure, risks, benefits and alternatives.
The patient understands, agrees and consents for the procedure.
All questions were addressed.  A time out was performed.

Maximal barrier sterile technique utilized including caps, mask,
sterile gowns, sterile gloves, large sterile drape, hand hygiene,
and betadine

Under sterile conditions, the left upper arm AV graft was accessed
with an 18 gauge angiocath.  Contrast injection performed for a
complete shuntogram from anastomosis to the right atrium.

Left shuntogram:  Arterial anastomosis to the brachial artery is
patent.  Left upper arm straight graft is patent.  Recurrent venous
anastomotic short segment significant stenosis noted to the
axillary vein.  Centrally the subclavian and innominate veins are
patent.  SVC patent.

Venous angioplasty:  Under sterile conditions and local anesthesia,
6-French sheath was inserted.  Guide wire access was confirmed
through the axillary short segment stenosis.  Overlapping 6 mm
angioplasty was performed with a 6 x 4 Conquest balloon.  Each
inflation for 60 seconds to 30 atmospheres.  Balloon deflated and
removed.  Final shuntogram demonstrates minimal residual wall
irregularity but no significant flow limiting stenosis.  Sheath and
guide wire removed.

Hemostasis obtained with a pursestring suture.  No immediate
complication.
IMPRESSION: Successful 6 mm venous angioplasty of a recurrent axillary venous
anastomotic stenosis.  Access ready for use.

## 2013-07-04 ENCOUNTER — Emergency Department (HOSPITAL_COMMUNITY)
Admission: EM | Admit: 2013-07-04 | Discharge: 2013-07-04 | Disposition: A | Discharge status: Home / Self Care | Payer: Medicare Other | Attending: Emergency Medicine | Admitting: Emergency Medicine

## 2013-07-04 ENCOUNTER — Encounter (HOSPITAL_COMMUNITY): Payer: Self-pay | Admitting: Emergency Medicine

## 2013-07-04 DIAGNOSIS — T82838A Hemorrhage of vascular prosthetic devices, implants and grafts, initial encounter: Secondary | ICD-10-CM

## 2013-07-04 DIAGNOSIS — I120 Hypertensive chronic kidney disease with stage 5 chronic kidney disease or end stage renal disease: Secondary | ICD-10-CM | POA: Insufficient documentation

## 2013-07-04 DIAGNOSIS — N186 End stage renal disease: Secondary | ICD-10-CM | POA: Insufficient documentation

## 2013-07-04 DIAGNOSIS — Z8719 Personal history of other diseases of the digestive system: Secondary | ICD-10-CM | POA: Insufficient documentation

## 2013-07-04 DIAGNOSIS — E785 Hyperlipidemia, unspecified: Secondary | ICD-10-CM | POA: Insufficient documentation

## 2013-07-04 DIAGNOSIS — Z87828 Personal history of other (healed) physical injury and trauma: Secondary | ICD-10-CM | POA: Insufficient documentation

## 2013-07-04 DIAGNOSIS — H409 Unspecified glaucoma: Secondary | ICD-10-CM | POA: Insufficient documentation

## 2013-07-04 DIAGNOSIS — Y841 Kidney dialysis as the cause of abnormal reaction of the patient, or of later complication, without mention of misadventure at the time of the procedure: Secondary | ICD-10-CM | POA: Insufficient documentation

## 2013-07-04 DIAGNOSIS — Z79899 Other long term (current) drug therapy: Secondary | ICD-10-CM | POA: Insufficient documentation

## 2013-07-04 DIAGNOSIS — Z992 Dependence on renal dialysis: Secondary | ICD-10-CM | POA: Insufficient documentation

## 2013-07-04 DIAGNOSIS — IMO0002 Reserved for concepts with insufficient information to code with codable children: Secondary | ICD-10-CM | POA: Insufficient documentation

## 2013-07-04 DIAGNOSIS — Z8549 Personal history of malignant neoplasm of other male genital organs: Secondary | ICD-10-CM | POA: Insufficient documentation

## 2013-07-04 DIAGNOSIS — N2581 Secondary hyperparathyroidism of renal origin: Secondary | ICD-10-CM | POA: Insufficient documentation

## 2013-07-04 NOTE — ED Notes (Signed)
Per EMS: pt coming from home with c/o bleeding shunt. Pt received dialysis yesterday, took shirt off today, scab came off shunt, shunt started to bleed. Upon EMS arrival pt had already controlled the bleeding. EMS reinforced the dressing. Pt A&Ox4, respirations equal and unlabored, skin warm and dry. Pt wants to make sure shunt is not "clog".

## 2013-07-04 NOTE — ED Notes (Signed)
MD at bedside. 

## 2013-07-04 NOTE — ED Provider Notes (Signed)
CSN: [id number]     Arrival date & time 07/04/13  4332 History   First MD Initiated Contact with Patient 07/04/13 [phone]     Chief Complaint  Patient presents with  . 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 He comes from home via EMS with bleeding from his dialysis graft. He had a usual run of dialysis yesterday without any complications or difficulty accessing the graft. The patient is not anticoagulated. He says that bleeding began spontaneously. He controlled bleeding with pressure dressing which was secured by EMS. NO bleeding by the time the patient had arrived in the ED.   Patient notes remote history of single similar episode.   Past Medical History  Diagnosis Date  . Diverticula of intestine 03/2010  . Hyperlipemia   . Hypertension   . Gout PER PT STABLE  . History of GI bleed 2012  . History of CVA (cerebrovascular accident) 1985 SECONDARY TO HTN---  LEFT HEMIPARISIS  . Secondary hyperparathyroidism of renal origin   . H/O hiatal hernia   . Glaucoma   . End stage renal failure on dialysis SECONDARY TO NEPHROSCLEROSIS    NEPHROLOGIST--- DR DETERDING--  HEMODIALYSIS ON  T/TH/ SAT--  LEFT UPPER ARM AV GRAFT  . RBBB   . 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 Penile cancer    Past Surgical History  Procedure Laterality Date  . Circumcision  05-23-2007    AND EXCISION PENILE MASS  . Destruction lesion penile  [id number]  . Dg av dialysis graft declot or  multiple times- last 4/11    lt upper forearm  av graft at present  . Co2 laser ablation of penile lesion  11-26-2007    RECURRENT SQUAMOUS CELL CARCINOMA  . Excisional bx penile lesion  07-22-2009  . Co2 laser of penile in situ carcinoma and bx  07-26-2010  . Laser ablation  of recurrent squamous  cell penile carcinoma  04-25-2011  . Penile biopsy N/A 06/23/2012    Procedure:  C02 LASER OF PENIS  ;  Surgeon: Franchot Gallo, MD;  Location: [facility];  Service: Urology;  Laterality: N/A;  . Yag laser application N/A 09/23/1658    Procedure: YAG LASER APPLICATION;  Surgeon: Franchot Gallo, MD;  Location: [facility];  Service: Urology;  Laterality: N/A;   History reviewed. No pertinent family history. History  Substance Use Topics  . Smoking status: Never Smoker   . Smokeless tobacco: Not on file  . Alcohol Use: No     Comment: quit 2002    Review of Systems Ten point review of symptoms performed and is negative with the exception of symptoms noted above.     Allergies  Review of patient's allergies indicates no known allergies.  Home Medications   Prior to Admission medications   Medication Sig Start Date End Date Taking? Authorizing Provider  calcium acetate (PHOSLO) 667 MG capsule Take 667 mg by mouth 3 (three) times daily with meals.   Yes Historical Provider, MD  cinacalcet (SENSIPAR) 30 MG tablet Take 30 mg by mouth at bedtime.   Yes Historical Provider, MD  dorzolamide (TRUSOPT) 2 % ophthalmic solution Place 1 drop into both eyes 2 (two) times daily.    Yes Historical Provider, MD  fish oil-omega-3 fatty acids 1000 MG capsule Take 2 g by mouth  daily.   Yes Historical Provider, MD  latanoprost (XALATAN) 0.005 % ophthalmic solution Place 1 drop into both eyes at bedtime.    Yes Historical Provider, MD  multivitamin (RENA-VIT) TABS tablet Take 1 tablet by mouth daily.   Yes Historical Provider, MD  polyethylene glycol (MIRALAX / GLYCOLAX) packet Take 17 g by mouth daily as needed for mild constipation.    Yes Historical Provider, MD  simvastatin (ZOCOR) 40 MG tablet Take 40 mg by mouth every evening.   Yes Historical Provider, MD   BP 113/62  Pulse 77  Temp(Src) 97.6 F (36.4 C) (Oral)  Resp 16  SpO2 100% Physical Exam Gen: well  developed and well nourished appearing Head: NCAT Eyes: PERL, EOMI Nose: no epistaixis or rhinorrhea Mouth/throat: mucosa is moist and pink Neck: no jvd Lungs: CTA B, no wheezing, rhonchi or rales CV: RRR, no murmur, extremities appear well perfused.  Abd: soft, notender, nondistended Back: no ttp, no cva ttp Skin: warm and dry Ext: left UE: pressure dressing in place which is partially saturated with blood. Dressing taken down. No active bleeding. Strong thrill present.  Neuro: CN ii-xii grossly intact,; left hemiparesis. Psyche; normal affect,  calm and cooperative.   ED Course  Procedures (including critical care time) Labs Review   MDM   S/P spontaneous hemorrhage from dialysis graft. Bleeding has been controlled for several hours of observation and the patient is stable for discharge with instructions to maintain his dressing until H/D on Monday.     Elyn Peers, MD 07/04/13 [phone]

## 2013-07-08 ENCOUNTER — Telehealth: Payer: Self-pay | Admitting: Vascular Surgery

## 2013-07-08 NOTE — Telephone Encounter (Addendum)
Message copied by Gena Fray on Wed Jul 08, 2013  2:42 PM ------      Message from: Denman George      Created: Wed Jul 08, 2013 11:17 AM      Regarding: needs appt.      Contact: [phone]       Rcc'd call from Ramiro Harvest, PA.  Pt. has a thinning and oozing @ (L) UA AVG site. Dr. Jimmy Footman is requesting appt. for evaluation.(non-emergent)  Per Dr. Oneida Alar, no vasc. lab needed.  Pt. dialyzes M-W-F.  He can be worked in with any MD, sooner than later. ------  07/08/13: left detailed message for pt with appt information. Asked for cb to confirm, dpm

## 2013-07-10 ENCOUNTER — Encounter: Payer: Self-pay | Admitting: Vascular Surgery

## 2013-07-14 ENCOUNTER — Ambulatory Visit: Payer: Medicare Other | Admitting: Vascular Surgery

## 2013-07-20 DEATH — deceased

## 2013-07-21 ENCOUNTER — Telehealth: Payer: Self-pay | Admitting: Vascular Surgery

## 2013-07-21 NOTE — Telephone Encounter (Signed)
Willies daughter called to let us know she passed on 2013-07-17. We already cancelled the app. Out.

## 2013-07-28 ENCOUNTER — Telehealth: Payer: Self-pay

## 2013-07-28 NOTE — Telephone Encounter (Signed)
Patient past away per Obituary  °
